# Patient Record
Sex: Male | Born: 1960 | Race: White | Hispanic: No | Marital: Married | Smoking: Never smoker
Health system: Southern US, Community
[De-identification: ages and names within clinical notes are randomized; demographics above are authoritative.]

## PROBLEM LIST (undated history)

## (undated) DIAGNOSIS — G931 Anoxic brain damage, not elsewhere classified: Secondary | ICD-10-CM

## (undated) DIAGNOSIS — F1090 Alcohol use, unspecified, uncomplicated: Secondary | ICD-10-CM

## (undated) DIAGNOSIS — Z7289 Other problems related to lifestyle: Secondary | ICD-10-CM

## (undated) DIAGNOSIS — Z7409 Other reduced mobility: Secondary | ICD-10-CM

## (undated) DIAGNOSIS — E785 Hyperlipidemia, unspecified: Secondary | ICD-10-CM

## (undated) DIAGNOSIS — Z8674 Personal history of sudden cardiac arrest: Secondary | ICD-10-CM

## (undated) DIAGNOSIS — I10 Essential (primary) hypertension: Secondary | ICD-10-CM

## (undated) DIAGNOSIS — G20 Parkinson's disease: Secondary | ICD-10-CM

## (undated) DIAGNOSIS — K219 Gastro-esophageal reflux disease without esophagitis: Secondary | ICD-10-CM

## (undated) DIAGNOSIS — F329 Major depressive disorder, single episode, unspecified: Secondary | ICD-10-CM

## (undated) DIAGNOSIS — Z789 Other specified health status: Secondary | ICD-10-CM

## (undated) DIAGNOSIS — K5790 Diverticulosis of intestine, part unspecified, without perforation or abscess without bleeding: Secondary | ICD-10-CM

## (undated) DIAGNOSIS — R296 Repeated falls: Secondary | ICD-10-CM

## (undated) DIAGNOSIS — G20C Parkinsonism, unspecified: Secondary | ICD-10-CM

## (undated) DIAGNOSIS — K5190 Ulcerative colitis, unspecified, without complications: Secondary | ICD-10-CM

## (undated) DIAGNOSIS — R569 Unspecified convulsions: Secondary | ICD-10-CM

## (undated) DIAGNOSIS — F419 Anxiety disorder, unspecified: Secondary | ICD-10-CM

## (undated) HISTORY — PX: INGUINAL HERNIA REPAIR: SUR1180

## (undated) HISTORY — DX: Ulcerative colitis, unspecified, without complications: K51.90

## (undated) HISTORY — PX: VASECTOMY: SHX75

## (undated) HISTORY — DX: Gastro-esophageal reflux disease without esophagitis: K21.9

## (undated) HISTORY — DX: Essential (primary) hypertension: I10

## (undated) HISTORY — PX: FINGER AMPUTATION: SHX636

## (undated) HISTORY — DX: Anxiety disorder, unspecified: F41.9

## (undated) HISTORY — DX: Unspecified convulsions: R56.9

## (undated) HISTORY — DX: Other specified health status: Z78.9

## (undated) HISTORY — DX: Other problems related to lifestyle: Z72.89

## (undated) HISTORY — DX: Major depressive disorder, single episode, unspecified: F32.9

## (undated) HISTORY — DX: Diverticulosis of intestine, part unspecified, without perforation or abscess without bleeding: K57.90

## (undated) HISTORY — DX: Parkinsonism, unspecified: G20.C

## (undated) HISTORY — DX: Repeated falls: R29.6

## (undated) HISTORY — PX: OTHER SURGICAL HISTORY: SHX169

## (undated) HISTORY — DX: Hyperlipidemia, unspecified: E78.5

## (undated) HISTORY — DX: Anoxic brain damage, not elsewhere classified: G93.1

## (undated) HISTORY — DX: Other reduced mobility: Z74.09

## (undated) HISTORY — DX: Alcohol use, unspecified, uncomplicated: F10.90

## (undated) HISTORY — DX: Personal history of sudden cardiac arrest: Z86.74

## (undated) HISTORY — DX: Parkinson's disease: G20

---

## 2016-04-05 ENCOUNTER — Non-Acute Institutional Stay (SKILLED_NURSING_FACILITY): Payer: 59 | Admitting: Adult Health

## 2016-04-05 DIAGNOSIS — R531 Weakness: Secondary | ICD-10-CM

## 2016-04-05 DIAGNOSIS — R1310 Dysphagia, unspecified: Secondary | ICD-10-CM

## 2016-04-05 DIAGNOSIS — G931 Anoxic brain damage, not elsewhere classified: Secondary | ICD-10-CM

## 2016-04-05 DIAGNOSIS — F32A Depression, unspecified: Secondary | ICD-10-CM

## 2016-04-05 DIAGNOSIS — K51919 Ulcerative colitis, unspecified with unspecified complications: Secondary | ICD-10-CM | POA: Diagnosis not present

## 2016-04-05 DIAGNOSIS — K219 Gastro-esophageal reflux disease without esophagitis: Secondary | ICD-10-CM

## 2016-04-05 DIAGNOSIS — F329 Major depressive disorder, single episode, unspecified: Secondary | ICD-10-CM | POA: Diagnosis not present

## 2016-04-05 DIAGNOSIS — I10 Essential (primary) hypertension: Secondary | ICD-10-CM

## 2016-04-05 DIAGNOSIS — G40909 Epilepsy, unspecified, not intractable, without status epilepticus: Secondary | ICD-10-CM | POA: Diagnosis not present

## 2016-04-05 DIAGNOSIS — G4700 Insomnia, unspecified: Secondary | ICD-10-CM | POA: Diagnosis not present

## 2016-04-05 DIAGNOSIS — E785 Hyperlipidemia, unspecified: Secondary | ICD-10-CM

## 2016-04-05 NOTE — Progress Notes (Signed)
DATE:  04/05/2016  MRN:  [id number]  BIRTHDAY: February 15, 1960  Facility:  Nursing Home Location:  [place] Room Number: 106-P  LEVEL OF CARE:  SNF (31)  Contact Information    None on File       Code Status History    This patient does not have a recorded code status. Please follow your organizational policy for patients in this situation.       Chief Complaint  Patient presents with  . Hospitalization Follow-up    HISTORY OF PRESENT ILLNESS:  This is a 50-YO male seen for hospital follow-up.  He was admitted to [facility] on 04/04/2016 for short-term rehabilitation post admission at [facility] rehabilitation in [place] 02/08/2016-04/04/2016 for acute anoxic encephalopathy. He has PMH of  Ulcerative colitis, hypertension, OSA and remote seizure ~ 10 years ago, S/P cardiac arrest @ football game 01/03/16, with bystander CPR, EMS arrived in 3 mins. Patient was asystole and regained consciousness after 1 round epinephrine. He was intubated in the field and admitted to the hospital. He then had seizure and was given Keppra and Valpoate. He was given Midazolam infusion, weaned off and replaced with Lacosamide and Clonazepam. He had acute rehabilitation there and now wanting to continue short-term rehabilitation @ [facility].    PAST MEDICAL HISTORY:  Past Medical History:  Diagnosis Date  . Alcohol use   . Hyperlipidemia   . Hypertension   . Seizures ([place])   . Ulcerative colitis ([place])      CURRENT MEDICATIONS: Reviewed  Patient's Medications  New Prescriptions   No medications on file  Previous Medications   ACETAMINOPHEN (TYLENOL) 325 MG TABLET    Place 650 mg into feeding tube every 4 (four) hours as needed for mild pain or fever.   ACETAMINOPHEN (TYLENOL) 325 MG TABLET    Take 650 mg by mouth 2 (two) times daily. For 167 doses   AMMONIUM LACTATE (LAC-HYDRIN) 12 % LOTION    Apply 1  application topically 2 (two) times daily. Apply to hands and feet for dry skin for 167 doses   AMPHETAMINE-DEXTROAMPHETAMINE (ADDERALL) 20 MG TABLET    Place 20 mg into feeding tube daily.   ATORVASTATIN (LIPITOR) 80 MG TABLET    Place 80 mg into feeding tube at bedtime.   BISACODYL (DULCOLAX) 10 MG SUPPOSITORY    Place 10 mg rectally every other day.   CARVEDILOL (COREG) 12.5 MG TABLET    Place 12.5 mg into feeding tube 2 (two) times daily with a meal.    CLOTRIMAZOLE-BETAMETH & ZN OX 1-0.05 & 20 % THPK    Apply 1 application topically 2 (two) times daily.   DONEPEZIL (ARICEPT) 5 MG TABLET    Place 5 mg into feeding tube at bedtime.   ENOXAPARIN (LOVENOX) 40 MG/0.4ML INJECTION    Inject 40 mg into the skin at bedtime.   LACOSAMIDE (VIMPAT) 200 MG TABS TABLET    Place 200 mg into feeding tube 2 (two) times daily.   LEVETIRACETAM (KEPPRA) 750 MG TABLET    1,500 mg 2 (two) times daily. Per tube   LISINOPRIL (PRINIVIL,ZESTRIL) 5 MG TABLET    Place 5 mg into feeding tube daily.   MESALAMINE (LIALDA) 1.2 G EC TABLET    2.4 g daily with breakfast. Per tube    MUPIROCIN CREAM (BACTROBAN) 2 %    Apply 1 application topically 2 (two) times daily. Apply to PEG site  PANTOPRAZOLE (PROTONIX) 40 MG TABLET    40 mg daily. Per tube   SERTRALINE (ZOLOFT) 100 MG TABLET    Place 100 mg into feeding tube daily.   THIAMINE 100 MG TABLET    Place 100 mg into feeding tube daily.   TRAZODONE (DESYREL) 50 MG TABLET    Place 50 mg into feeding tube at bedtime.  Modified Medications   No medications on file  Discontinued Medications   CLOTRIMAZOLE-BETAMETHASONE (LOTRISONE) CREAM    Apply 1 application topically 2 (two) times daily. Apply to bottom for protection   NUTRITIONAL SUPPLEMENTS (FEEDING SUPPLEMENT, OSMOLITE 1.5 CAL,) LIQD    Place 240 mLs into feeding tube continuous. If <50% of meal consumed     Allergies  Allergen Reactions  . Fluconazole      REVIEW OF SYSTEMS:  Unable to obtain due to anoxic  encephalopathy    PHYSICAL EXAMINATION  GENERAL APPEARANCE: Well nourished. In no acute distress. Normal body habitus SKIN:  Skin is warm and dry.  HEAD: Normal in size and contour. No evidence of trauma EYES: Lids open and close normally. No blepharitis, entropion or ectropion. PERRL. Conjunctivae are clear and sclerae are white. Lenses are without opacity EARS: Pinnae are normal. Patient hears normal voice tunes of the examiner MOUTH and THROAT: Lips are without lesions. Oral mucosa is moist and without lesions. 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 Affect and behavior are appropriate   LABS/RADIOLOGY: Labs reviewed: Basic Metabolic Panel:  Recent Labs  04/06/16  NA 142  K 4.2  BUN 11  CREATININE 0.7   Liver Function Tests:  Recent Labs  04/06/16  AST 17  ALT 32  ALKPHOS 97   CBC:  Recent Labs  04/06/16  WBC 8.3  HGB 14.1  HCT 41  PLT 298    ASSESSMENT/PLAN:  Generalized weakness - for rehabilitation, PT and OT, for therapeutic strengthening exercises; fall precaution; continue Lovenox 40 mg subcutaneous daily at bedtime; check CBC  Anoxic encephalopathy - S/P cardiac arrest and seizure, continue supportive care; continue Adderall 20 mg 1 tab daily and Aricept 5 mg 1 tab daily at bedtime  Dysphagia - currently on mechanical soft  NAS diet and has bolus of osmolite 1.5 240 ml via PEG tube if meal intake < 50%; for Speech therapy evaluation;  aspiration precaution  Hypertension - continue Coreg 12.5 mg 1 tab every 12 hours, lisinopril 5 mg 1 tab daily; check BMP  Seizure - continue Lacosamide  200 mg every 12 hours and Keppra 750 mg 2 tabs = 1500 mg every 12 hours  GERD - Continue Protonix 40 mg daily  Depression - continue Zoloft 100 mg 1 tab daily  Ulcerative colitis - continue mesalamine DR 1.2 g 2 tabs daily  Hyperlipidemia - continue Lipitor 80 mg 1 tab daily at bedtime  Insomnia - continue trazodone 50 mg daily at bedtime    Goals of care:  Short-term rehabilitation    Wilburt Messina C. [facility] [phone]

## 2016-04-06 LAB — CBC AND DIFFERENTIAL
HEMATOCRIT: 41 % (ref 41–53)
HEMOGLOBIN: 14.1 g/dL (ref 13.5–17.5)
PLATELETS: 298 10*3/uL (ref 150–399)
WBC: 8.3 10*3/mL

## 2016-04-06 LAB — HEPATIC FUNCTION PANEL
ALK PHOS: 97 U/L (ref 25–125)
ALT: 32 U/L (ref 10–40)
AST: 17 U/L (ref 14–40)
BILIRUBIN, TOTAL: 0.5 mg/dL

## 2016-04-06 LAB — BASIC METABOLIC PANEL
BUN: 11 mg/dL (ref 4–21)
Creatinine: 0.7 mg/dL (ref 0.6–1.3)
Glucose: 92 mg/dL
Potassium: 4.2 mmol/L (ref 3.4–5.3)
Sodium: 142 mmol/L (ref 137–147)

## 2016-04-07 ENCOUNTER — Non-Acute Institutional Stay (SKILLED_NURSING_FACILITY): Payer: 59 | Admitting: Internal Medicine

## 2016-04-07 ENCOUNTER — Encounter: Payer: Self-pay | Admitting: Internal Medicine

## 2016-04-07 DIAGNOSIS — K5900 Constipation, unspecified: Secondary | ICD-10-CM

## 2016-04-07 DIAGNOSIS — F32A Depression, unspecified: Secondary | ICD-10-CM | POA: Insufficient documentation

## 2016-04-07 DIAGNOSIS — K5190 Ulcerative colitis, unspecified, without complications: Secondary | ICD-10-CM | POA: Insufficient documentation

## 2016-04-07 DIAGNOSIS — R1310 Dysphagia, unspecified: Secondary | ICD-10-CM | POA: Insufficient documentation

## 2016-04-07 DIAGNOSIS — G931 Anoxic brain damage, not elsewhere classified: Secondary | ICD-10-CM | POA: Insufficient documentation

## 2016-04-07 DIAGNOSIS — K219 Gastro-esophageal reflux disease without esophagitis: Secondary | ICD-10-CM | POA: Diagnosis not present

## 2016-04-07 DIAGNOSIS — I10 Essential (primary) hypertension: Secondary | ICD-10-CM | POA: Diagnosis not present

## 2016-04-07 DIAGNOSIS — G40909 Epilepsy, unspecified, not intractable, without status epilepticus: Secondary | ICD-10-CM | POA: Diagnosis not present

## 2016-04-07 DIAGNOSIS — Z87898 Personal history of other specified conditions: Secondary | ICD-10-CM | POA: Diagnosis not present

## 2016-04-07 DIAGNOSIS — E785 Hyperlipidemia, unspecified: Secondary | ICD-10-CM | POA: Insufficient documentation

## 2016-04-07 DIAGNOSIS — F329 Major depressive disorder, single episode, unspecified: Secondary | ICD-10-CM | POA: Diagnosis not present

## 2016-04-07 DIAGNOSIS — R531 Weakness: Secondary | ICD-10-CM | POA: Diagnosis not present

## 2016-04-07 DIAGNOSIS — K51919 Ulcerative colitis, unspecified with unspecified complications: Secondary | ICD-10-CM | POA: Diagnosis not present

## 2016-04-07 NOTE — Progress Notes (Signed)
LOCATION: [place]  PCP: No primary care provider on file.   Code Status: Full Code  Goals of care: Advanced Directive information No flowsheet data found.     No emergency contact information on file.   Allergies  Allergen Reactions  . Fluconazole     Chief Complaint  Patient presents with  . New Admit To SNF    New Admission Visit      HPI:  Patient is a 56 y.o. male seen today for short term rehabilitation post admission in inpatient rehabilitation from 02/08/16-04/04/16 at [place] post acute anoxic encephalopathy secondary to cardiac arrest with status epilepticus. He required intubation, tracheostomy and peg tube during that hospitalization. No hospital admission and discharge summary available for review. Patient has medical history of ulcerative colitis, seizure disorder, HTN, HLD on chart review. He is seen in his room today. His trach has been cannulated. He is eating around 75% of his meals per nursing. He has encephalopathy and has limited participation in HPI and ROS.   Review of Systems:  Constitutional: Negative for fever, chills, diaphoresis. Feels weak and tired.  HENT: Negative for headache, congestion, nasal discharge, difficulty swallowing.   Eyes: Negative for eye pain, blurred vision, double vision and discharge.  Respiratory: Negative for cough, shortness of breath and wheezing.   Cardiovascular: Negative for chest pain, palpitations, leg swelling.  Gastrointestinal: Negative for heartburn, nausea, vomiting, abdominal pain. Last bowel movement was this morning. Genitourinary: Negative for dysuria Musculoskeletal: Negative for fall in the facility.  Skin: Negative for itching, rash.  Neurological: Negative for dizziness.    Past Medical History:  Diagnosis Date  . Alcohol use   . Hyperlipidemia   . Hypertension   . Seizures ([place])   . Ulcerative colitis ([place])    History reviewed. No pertinent surgical history. Social History:  has no tobacco, alcohol, and drug history on file.  History reviewed. No pertinent family history.  Medications: Allergies as of 04/07/2016      Reactions   Fluconazole       Medication List       Accurate as of 04/07/16  2:40 PM. Always use your most recent med list.          acetaminophen 325 MG tablet Commonly known as:  TYLENOL Place 650 mg into feeding tube every 4 (four) hours as needed for mild pain or fever.   acetaminophen 325 MG tablet Commonly known as:  TYLENOL Take 650 mg by mouth 2 (two) times daily. For 167 doses   ammonium lactate 12 % lotion Commonly known as:  LAC-HYDRIN Apply 1 application topically 2 (two) times daily. Apply to hands and feet for dry skin for 167 doses   amphetamine-dextroamphetamine 20 MG tablet Commonly known as:  ADDERALL Place 20 mg into feeding tube daily.   atorvastatin 80 MG tablet Commonly known as:  LIPITOR Place 80 mg into feeding tube at bedtime.   BACTROBAN 2 % Generic drug:  mupirocin cream Apply 1 application topically 2 (two) times daily. Apply to PEG site   bisacodyl 10 MG suppository Commonly known as:  DULCOLAX Place 10 mg rectally every other day.   carvedilol 12.5 MG tablet Commonly known as:  COREG Place 12.5 mg into feeding tube 2 (two) times daily with a meal.   Clotrimazole-Betameth & Zn Ox 1-0.05 & 20 % Thpk Apply 1 application topically 2 (two) times daily.   donepezil 5 MG tablet Commonly known as:  ARICEPT Place 5 mg into feeding  tube at bedtime.   enoxaparin 40 MG/0.4ML injection Commonly known as:  LOVENOX Inject 40 mg into the skin at bedtime.   lacosamide 200 MG Tabs tablet Commonly known as:  VIMPAT Place 200 mg into feeding tube 2 (two) times daily.   levETIRAcetam 750 MG tablet Commonly known as:  KEPPRA 1,500 mg 2 (two) times daily. Per tube   lisinopril 5 MG tablet Commonly known as:  PRINIVIL,ZESTRIL Place 5 mg into feeding tube daily.   mesalamine 1.2 g EC  tablet Commonly known as:  LIALDA 2.4 g daily with breakfast. Per tube   pantoprazole 40 MG tablet Commonly known as:  PROTONIX 40 mg daily. Per tube   sertraline 100 MG tablet Commonly known as:  ZOLOFT Place 100 mg into feeding tube daily.   thiamine 100 MG tablet Place 100 mg into feeding tube daily.   traZODone 50 MG tablet Commonly known as:  DESYREL Place 50 mg into feeding tube at bedtime.       Immunizations:  There is no immunization history on file for this patient.   Physical Exam: Vitals:   04/07/16 1050  BP: 115/76  Pulse: 69  Resp: 16  Temp: (!) 96.1 F (35.6 C)  TempSrc: Oral  SpO2: 95%   There is no height or weight on file to calculate BMI.  General- adult male, well built, in no acute distress Head- normocephalic, atraumatic Nose- no nasal discharge Throat- moist mucus membrane, normal oropharynx Eyes- PERRLA, EOMI, no pallor, no icterus, no discharge, normal conjunctiva, normal sclera Neck- no cervical lymphadenopathy Cardiovascular- normal s1,s2, no murmur Respiratory- bilateral clear to auscultation, no wheeze, no rhonchi, no crackles, no use of accessory muscles, trach stoma closed.  Abdomen- bowel sounds present, soft, non tender, no guarding or rigidity, no CVA tenderness, peg tube in place with site clean Musculoskeletal- able to move all 4 extremities, generalized weakness, on wheelchair, no leg edema, right middle finger amputated Neurological- alert and oriented to self only Skin- warm and dry Psychiatry- normal mood, poor insight    Labs reviewed: Basic Metabolic Panel:  Recent Labs  04/06/16  NA 142  K 4.2  BUN 11  CREATININE 0.7   Liver Function Tests:  Recent Labs  04/06/16  AST 17  ALT 32  ALKPHOS 97   No results for input(s): LIPASE, AMYLASE in the last 8760 hours. No results for input(s): AMMONIA in the last 8760 hours. CBC:  Recent Labs  04/06/16  WBC 8.3  HGB 14.1  HCT 41  PLT 298      Assessment/Plan  Generalized weakness Will have him work with physical therapy and occupational therapy team to help with gait training and muscle strengthening exercises.fall precautions. Skin care. Encourage to be out of bed. Currently on lovenox for dvt prophylaxis. Will need to reassess and discontinue this once mobility improves  Anoxic encephalopathy 2/2 cardiac arrest and seizure. Continue adderall with aricpet and provide supportive care. SLP consult.   Dysphagia Aspiration precautions, peg tube feed and assist with po feed. RD and SLP to follow. Patient has been taking 75% of his meals for breakfast and lunch, he is not a big dinner person. Evaluate his daily intake and weight and consider stopping peg tube feed if po intake is good.   Hypertension Monitor BP reading, continue coreg 12.5 mg bid, lisinopril 5 mg daily. Reviewed BMP  Seizure disorder Continue lacosamide 200 mg bid and keppra 1500 mg bid.   gerd Stable, continue protonix  Depression Stable mood,  psych consult, continue zoloft and trazodone  Constipation On dulcolax suppository qod for now  History of alcohol use Continue thiamine  Ulcerative colitis No flare up reported. Continue mesalamine for now  Hyperlipidemia Check lipid panel, continue lipitor for now    Goals of care: short term rehabilitation   Labs/tests ordered: lipid panel  Family/ staff Communication: reviewed care plan with patient and nursing supervisor    Blanchie Serve, MD Internal Medicine [place], [place] [address] Cell Phone (Monday-Friday 8 am - 5 pm): [phone] On Call: [phone] and follow prompts after 5 pm and on weekends Office Phone: [phone] Office Fax: [phone]

## 2016-04-27 LAB — BASIC METABOLIC PANEL
BUN: 11 mg/dL (ref 4–21)
CREATININE: 0.7 mg/dL (ref 0.6–1.3)
Glucose: 90 mg/dL
Potassium: 3.7 mmol/L (ref 3.4–5.3)
SODIUM: 142 mmol/L (ref 137–147)

## 2016-04-27 LAB — CBC AND DIFFERENTIAL
HEMATOCRIT: 40 % — AB (ref 41–53)
Hemoglobin: 14 g/dL (ref 13.5–17.5)
PLATELETS: 298 10*3/uL (ref 150–399)
WBC: 7.4 10^3/mL

## 2016-05-14 ENCOUNTER — Encounter: Payer: Self-pay | Admitting: Adult Health

## 2016-05-14 ENCOUNTER — Non-Acute Institutional Stay (SKILLED_NURSING_FACILITY): Payer: 59 | Admitting: Adult Health

## 2016-05-14 DIAGNOSIS — I10 Essential (primary) hypertension: Secondary | ICD-10-CM

## 2016-05-14 DIAGNOSIS — G4700 Insomnia, unspecified: Secondary | ICD-10-CM

## 2016-05-14 DIAGNOSIS — G931 Anoxic brain damage, not elsewhere classified: Secondary | ICD-10-CM | POA: Diagnosis not present

## 2016-05-14 DIAGNOSIS — E785 Hyperlipidemia, unspecified: Secondary | ICD-10-CM | POA: Diagnosis not present

## 2016-05-14 DIAGNOSIS — G40909 Epilepsy, unspecified, not intractable, without status epilepticus: Secondary | ICD-10-CM

## 2016-05-14 DIAGNOSIS — K219 Gastro-esophageal reflux disease without esophagitis: Secondary | ICD-10-CM | POA: Diagnosis not present

## 2016-05-14 DIAGNOSIS — R1310 Dysphagia, unspecified: Secondary | ICD-10-CM

## 2016-05-14 DIAGNOSIS — F329 Major depressive disorder, single episode, unspecified: Secondary | ICD-10-CM

## 2016-05-14 DIAGNOSIS — K51919 Ulcerative colitis, unspecified with unspecified complications: Secondary | ICD-10-CM | POA: Diagnosis not present

## 2016-05-14 DIAGNOSIS — F32A Depression, unspecified: Secondary | ICD-10-CM

## 2016-05-14 NOTE — Progress Notes (Signed)
DATE: 05/14/2016  MRN:  [id number]  BIRTHDAY: Sep 08, 1960  Facility:  Nursing Home Location:  [place] Room Number: 803-B  LEVEL OF CARE:  SNF (77)  Contact Information    Name Relation Home Work Mobile   Contact,No Other [phone]         Code Status History    This patient does not have a recorded code status. Please follow your organizational policy for patients in this situation.       Chief Complaint  Patient presents with  . Medical Management of Chronic Issues    HISTORY OF PRESENT ILLNESS:  This is a 79-YO male who is currently having short-term rehabilitation. He was seen in the room and did not verbalize any concerns. He was recently discharged from PT and [place]. His Trazodone dosage was increased from 50 mg to 75 mg Q HS.  He was admitted to [facility] on 04/04/2016 for short-term rehabilitation post admission at [facility] rehabilitation in [place] 02/08/2016-04/04/2016 for acute anoxic encephalopathy. He has PMH of  Ulcerative colitis, hypertension, OSA and remote seizure ~ 10 years ago, S/P cardiac arrest @ football game 01/03/16, with bystander CPR, EMS arrived in 3 mins. Patient was asystole and regained consciousness after 1 round epinephrine. He was intubated in the field and admitted to the hospital. He then had seizure and was given Keppra and Valpoate. He was given Midazolam infusion, weaned off and replaced with Lacosamide and Clonazepam.    PAST MEDICAL HISTORY:  Past Medical History:  Diagnosis Date  . Alcohol use   . Hyperlipidemia   . Hypertension   . Seizures ([place])   . Ulcerative colitis ([place])      CURRENT MEDICATIONS: Reviewed  Patient's Medications  New Prescriptions   No medications on file  Previous Medications   ACETAMINOPHEN (TYLENOL) 325 MG TABLET    Place 650 mg into feeding tube every 4 (four) hours as needed for mild pain or fever.   ACETAMINOPHEN (TYLENOL) 325 MG  TABLET    Take 650 mg by mouth 2 (two) times daily. For 167 doses   AMMONIUM LACTATE (LAC-HYDRIN) 12 % LOTION    Apply 1 application topically 2 (two) times daily. Apply to hands and feet for dry skin for 167 doses   ATORVASTATIN (LIPITOR) 40 MG TABLET    Take 40 mg by mouth daily.   BISACODYL (DULCOLAX) 10 MG SUPPOSITORY    Place 10 mg rectally every other day.   CARVEDILOL (COREG) 12.5 MG TABLET    Place 12.5 mg into feeding tube 2 (two) times daily with a meal.    CLOTRIMAZOLE-BETAMETH & ZN OX 1-0.05 & 20 % THPK    Apply 1 application topically 2 (two) times daily.   DONEPEZIL (ARICEPT) 5 MG TABLET    Place 5 mg into feeding tube at bedtime.   GUAIFENESIN (ROBITUSSIN) 100 MG/5ML SYRUP    Take 200 mg by mouth every 4 (four) hours as needed for cough.   LEVETIRACETAM (KEPPRA) 750 MG TABLET    1,500 mg 2 (two) times daily. Per tube   LISINOPRIL (PRINIVIL,ZESTRIL) 5 MG TABLET    Place 5 mg into feeding tube daily.   MESALAMINE (LIALDA) 1.2 G EC TABLET    2.4 g daily with breakfast. Per tube    MUPIROCIN CREAM (BACTROBAN) 2 %    Apply 1 application topically 2 (two) times daily. Apply to PEG site   NUTRITIONAL SUPPLEMENT LIQD  Take 120 mLs by mouth 3 (three) times daily.   NUTRITIONAL SUPPLEMENTS (FEEDING SUPPLEMENT, OSMOLITE 1.5 CAL,) LIQD    Place 237 mLs into feeding tube. Bolus 1 can if patient fails to consume <50% of his meal   PANTOPRAZOLE (PROTONIX) 40 MG TABLET    40 mg daily. Per tube   SERTRALINE (ZOLOFT) 100 MG TABLET    Place 150 mg into feeding tube daily. Take 1-1/2 tablets to = 150 mg   THIAMINE 100 MG TABLET    Place 100 mg into feeding tube daily.   TRAZODONE (DESYREL) 50 MG TABLET    Place 50 mg into feeding tube at bedtime.  Modified Medications   No medications on file  Discontinued Medications   AMPHETAMINE-DEXTROAMPHETAMINE (ADDERALL) 20 MG TABLET    Place 20 mg into feeding tube daily.   ATORVASTATIN (LIPITOR) 80 MG TABLET    Place 80 mg into feeding tube at bedtime.    ENOXAPARIN (LOVENOX) 40 MG/0.4ML INJECTION    Inject 40 mg into the skin at bedtime.   LACOSAMIDE (VIMPAT) 200 MG TABS TABLET    Place 200 mg into feeding tube 2 (two) times daily.     Allergies  Allergen Reactions  . Fluconazole      REVIEW OF SYSTEMS:  Unable to obtain due to anoxic encephalopathy    PHYSICAL EXAMINATION  GENERAL APPEARANCE: Well nourished. In no acute distress. Normal body habitus SKIN:  Skin is warm and dry.  HEAD: Normal in size and contour. No evidence of trauma EYES: Lids open and close normally. No blepharitis, entropion or ectropion. PERRL. Conjunctivae are clear and sclerae are white. Lenses are without opacity EARS: Pinnae are normal. Patient hears normal voice tunes of the examiner MOUTH and THROAT: Lips are without lesions. Oral mucosa is moist and without lesions. 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 Affect and behavior are appropriate   LABS/RADIOLOGY: Labs reviewed: Basic Metabolic Panel:  Recent Labs  04/06/16  NA 142  K 4.2  BUN 11  CREATININE 0.7   Liver Function Tests:  Recent Labs  04/06/16  AST 17  ALT 32  ALKPHOS 97   CBC:  Recent Labs  04/06/16  WBC 8.3  HGB 14.1  HCT 41  PLT 298    ASSESSMENT/PLAN:  Anoxic encephalopathy - S/P cardiac arrest and seizure, continue supportive care; continue Adderall 20 mg 1 tab daily and Aricept 5 mg 1 tab daily at bedtime  Dysphagia - currently on mechanical soft   NAS diet and has bolus of osmolite 1.5 240 ml via PEG tube if meal intake < 50%; for Speech therapy evaluation; aspiration precaution  Hypertension -  Well-controlled; continue Coreg 12.5 mg 1 tab every 12 hours and lisinopril 5 mg 1 tab daily  Seizure - continue Lacosamide  200 mg every 12 hours and Keppra 750 mg 2 tabs = 1500 mg every 12 hours  GERD - Continue Protonix 40 mg daily  Depression - mood is stable; continue Zoloft 100 mg 1 tab daily  Ulcerative colitis - continue mesalamine DR 1.2 g 2 tabs daily  Hyperlipidemia - decrease Lipitor from 80 mg to 40 mg1 tab daily at bedtime per wife's request due to muscle aches  Insomnia - recently increased trazodone from 50 mg to 75 mg  at bedtime    Goals of care:  Short-term rehabilitation    Shelle Galdamez C. [place] - NP    [employer] [phone]

## 2016-06-08 ENCOUNTER — Emergency Department (HOSPITAL_COMMUNITY): Payer: 59

## 2016-06-08 ENCOUNTER — Encounter: Payer: Self-pay | Admitting: Adult Health

## 2016-06-08 ENCOUNTER — Non-Acute Institutional Stay (SKILLED_NURSING_FACILITY): Payer: 59 | Admitting: Adult Health

## 2016-06-08 ENCOUNTER — Other Ambulatory Visit: Payer: Self-pay

## 2016-06-08 ENCOUNTER — Emergency Department (HOSPITAL_COMMUNITY)
Admission: EM | Admit: 2016-06-08 | Discharge: 2016-06-09 | Disposition: A | Discharge status: Home / Self Care | Payer: 59 | Attending: Emergency Medicine | Admitting: Emergency Medicine

## 2016-06-08 ENCOUNTER — Encounter (HOSPITAL_COMMUNITY): Payer: Self-pay | Admitting: Pharmacy Technician

## 2016-06-08 DIAGNOSIS — G40909 Epilepsy, unspecified, not intractable, without status epilepticus: Secondary | ICD-10-CM | POA: Diagnosis not present

## 2016-06-08 DIAGNOSIS — E785 Hyperlipidemia, unspecified: Secondary | ICD-10-CM | POA: Diagnosis not present

## 2016-06-08 DIAGNOSIS — G4700 Insomnia, unspecified: Secondary | ICD-10-CM | POA: Diagnosis not present

## 2016-06-08 DIAGNOSIS — K51919 Ulcerative colitis, unspecified with unspecified complications: Secondary | ICD-10-CM

## 2016-06-08 DIAGNOSIS — F0390 Unspecified dementia without behavioral disturbance: Secondary | ICD-10-CM | POA: Diagnosis not present

## 2016-06-08 DIAGNOSIS — I10 Essential (primary) hypertension: Secondary | ICD-10-CM | POA: Insufficient documentation

## 2016-06-08 DIAGNOSIS — K219 Gastro-esophageal reflux disease without esophagitis: Secondary | ICD-10-CM

## 2016-06-08 DIAGNOSIS — R1310 Dysphagia, unspecified: Secondary | ICD-10-CM

## 2016-06-08 DIAGNOSIS — R69 Illness, unspecified: Secondary | ICD-10-CM

## 2016-06-08 DIAGNOSIS — F329 Major depressive disorder, single episode, unspecified: Secondary | ICD-10-CM

## 2016-06-08 DIAGNOSIS — Z5181 Encounter for therapeutic drug level monitoring: Secondary | ICD-10-CM | POA: Insufficient documentation

## 2016-06-08 DIAGNOSIS — Z87898 Personal history of other specified conditions: Secondary | ICD-10-CM | POA: Diagnosis not present

## 2016-06-08 DIAGNOSIS — Z79899 Other long term (current) drug therapy: Secondary | ICD-10-CM | POA: Diagnosis not present

## 2016-06-08 DIAGNOSIS — R569 Unspecified convulsions: Secondary | ICD-10-CM | POA: Diagnosis present

## 2016-06-08 DIAGNOSIS — F32A Depression, unspecified: Secondary | ICD-10-CM

## 2016-06-08 LAB — CBC WITH DIFFERENTIAL/PLATELET
Basophils Absolute: 0 10*3/uL (ref 0.0–0.1)
Basophils Relative: 0 %
EOS ABS: 0.3 10*3/uL (ref 0.0–0.7)
EOS PCT: 3 %
HCT: 40.1 % (ref 39.0–52.0)
Hemoglobin: 13.9 g/dL (ref 13.0–17.0)
LYMPHS ABS: 2.6 10*3/uL (ref 0.7–4.0)
LYMPHS PCT: 21 %
MCH: 31.1 pg (ref 26.0–34.0)
MCHC: 34.7 g/dL (ref 30.0–36.0)
MCV: 89.7 fL (ref 78.0–100.0)
MONO ABS: 0.6 10*3/uL (ref 0.1–1.0)
MONOS PCT: 5 %
Neutro Abs: 8.7 10*3/uL — ABNORMAL HIGH (ref 1.7–7.7)
Neutrophils Relative %: 71 %
PLATELETS: 273 10*3/uL (ref 150–400)
RBC: 4.47 MIL/uL (ref 4.22–5.81)
RDW: 12.5 % (ref 11.5–15.5)
WBC: 12.3 10*3/uL — ABNORMAL HIGH (ref 4.0–10.5)

## 2016-06-08 LAB — COMPREHENSIVE METABOLIC PANEL
ALK PHOS: 84 U/L (ref 38–126)
ALT: 19 U/L (ref 17–63)
AST: 17 U/L (ref 15–41)
Albumin: 3.4 g/dL — ABNORMAL LOW (ref 3.5–5.0)
Anion gap: 9 (ref 5–15)
BUN: 11 mg/dL (ref 6–20)
CALCIUM: 8.9 mg/dL (ref 8.9–10.3)
CHLORIDE: 103 mmol/L (ref 101–111)
CO2: 25 mmol/L (ref 22–32)
CREATININE: 0.71 mg/dL (ref 0.61–1.24)
GFR calc non Af Amer: >60 mL/min (ref >60)
GLUCOSE: 121 mg/dL — AB (ref 65–99)
Potassium: 3.6 mmol/L (ref 3.5–5.1)
SODIUM: 137 mmol/L (ref 135–145)
Total Bilirubin: 0.6 mg/dL (ref 0.3–1.2)
Total Protein: 6.2 g/dL — ABNORMAL LOW (ref 6.5–8.1)

## 2016-06-08 LAB — I-STAT TROPONIN, ED: TROPONIN I, POC: 0 ng/mL (ref 0.00–0.08)

## 2016-06-08 LAB — PROTIME-INR
INR: 1.16
Prothrombin Time: 14.9 seconds (ref 11.4–15.2)

## 2016-06-08 LAB — LIPASE, BLOOD: Lipase: 30 U/L (ref 11–51)

## 2016-06-08 MED ORDER — LEVETIRACETAM 500 MG PO TABS
1500.0000 mg | ORAL_TABLET | Freq: Once | ORAL | Status: AC
  Administered 2016-06-09: 1500 mg via ORAL
  Filled 2016-06-08: qty 3

## 2016-06-08 MED ORDER — SODIUM CHLORIDE 0.9 % IV SOLN
200.0000 mg | Freq: Two times a day (BID) | INTRAVENOUS | Status: DC
  Administered 2016-06-08: 200 mg via INTRAVENOUS
  Filled 2016-06-08 (×2): qty 20

## 2016-06-08 NOTE — ED Triage Notes (Signed)
BIB EMS from [facility], pt had seizure lasting approx 5 min, pt is post ictal at this time. Frequent PVC's en route. Pt has only had 1 seizure before in march leading to cardiac arrest. EDP aware. VSS. CBG 111.

## 2016-06-08 NOTE — ED Provider Notes (Signed)
[facility] DEPT Provider Note   CSN: [id number] Arrival date & time: 06/08/16  1911     History   Chief Complaint Chief Complaint  Patient presents with  . Seizures    HPI Alexander Henderson is a 56 y.o. male.  HPI Patient has complex medical history with sudden onset of an arrest situation March a year ago. He had been with his wife at a football game where they had been tailgating and very suddenly and unexpectedly became unresponsive with possible seizure-like activity. He then went into cardiac arrest and had a prolonged resuscitation on route to the hospital. He was resuscitated in the emergency department but then had prolonged cardiac ICU hospitalization with hypothermia. When bringing the patient on hypothermia he did not respond appropriately and it was determined he would had constant ongoing seizure activity. He then subsequently was in the neurologic ICU and induced coma to control seizures. Total hospitalization was approximately 30 days. Since that time patient has been going on undergoing rehabilitation and is currently in nursing home care. His wife is with him. She reports that he has made significant improvement and is walking with a walker. He can interact verbally although he has significant limitations now. He did not ever develop seizure activity after that first hospitalization. He has been on Keppra continuously. His wife notes however that the MR indicates Vimpat was discontinued 2 months ago and she had not been made aware of that. This evening when she went to visit him after work he was slightly less interactive than normal and less inclined to eat. He then stiffened up and his eyes rolled back and he had approximately 5 minutes of seizure activity. Upon EMS arrival symptoms were beginning to abate. He had a brief postictal period but by the time he arrives emergency department he was back to baseline. The patient's wife visits him regularly and is very familiar with  his care and daily activities. The patient cannot provide review systems but she reports he has not shown signs of acute illness. He has not had fevers, chills, vomiting. There've been no appetite change until just before the seizure occurred. Past Medical History:  Diagnosis Date  . Alcohol use   . Hyperlipidemia   . Hypertension   . Seizures ([place])   . Ulcerative colitis [facility])     Patient Active Problem List   Diagnosis Date Noted  . Anoxic encephalopathy ([place]) 04/07/2016  . Dysphagia 04/07/2016  . Essential hypertension 04/07/2016  . Seizure disorder ([place]) 04/07/2016  . Gastroesophageal reflux disease 04/07/2016  . Depression 04/07/2016  . History of alcohol use 04/07/2016  . Ulcerative colitis with complication ([place]) 50/53/9767  . Hyperlipidemia 04/07/2016    History reviewed. No pertinent surgical history.     Home Medications    Prior to Admission medications   Medication Sig Start Date End Date Taking? Authorizing Provider  acetaminophen (TYLENOL) 325 MG tablet Take 650 mg by mouth every 4 (four) hours as needed for mild pain or fever.    Yes [provider]  acetaminophen (TYLENOL) 325 MG tablet Take 650 mg by mouth 2 (two) times daily. For 167 doses   Yes [provider]  ammonium lactate (LAC-HYDRIN) 12 % lotion Apply 1 application topically 2 (two) times daily. Apply to hands and feet for dry skin for 167 doses   Yes [provider]  atorvastatin (LIPITOR) 40 MG tablet Take 40 mg by mouth daily.   Yes [provider]  bisacodyl (DULCOLAX) 10 MG  suppository Place 10 mg rectally every other day.   Yes [provider]  carvedilol (COREG) 12.5 MG tablet Take 12.5 mg by mouth 2 (two) times daily with a meal.    Yes [provider]  Clotrimazole-Betameth & Zn Ox 1-0.05 & 20 % THPK Apply 1 application topically 2 (two) times daily.   Yes [provider]  donepezil (ARICEPT) 5 MG tablet Take 5 mg by mouth at  bedtime.    Yes [provider]  levETIRAcetam (KEPPRA) 750 MG tablet Take 1,500 mg by mouth 2 (two) times daily. Per tube    Yes [provider]  lisinopril (PRINIVIL,ZESTRIL) 5 MG tablet Place 5 mg into feeding tube daily.   Yes [provider]  mesalamine (LIALDA) 1.2 g EC tablet Take 2.4 g by mouth daily with breakfast.    Yes [provider]  mupirocin cream (BACTROBAN) 2 % Apply 1 application topically 2 (two) times daily. Apply to PEG site   Yes [provider]  NUTRITIONAL SUPPLEMENT LIQD Take 120 mLs by mouth 3 (three) times daily.   Yes [provider]  Nutritional Supplements (FEEDING SUPPLEMENT, OSMOLITE 1.5 CAL,) LIQD Place 237 mLs into feeding tube. Bolus 1 can if patient fails to consume <50% of his meal   Yes [provider]  pantoprazole (PROTONIX) 40 MG tablet 40 mg daily.    Yes [provider]  sertraline (ZOLOFT) 100 MG tablet Take 150 mg by mouth daily. Take 1-1/2 tablets to = 150 mg   Yes [provider]  thiamine 100 MG tablet Take 100 mg by mouth daily.    Yes [provider]  traZODone (DESYREL) 50 MG tablet Take 75 mg by mouth at bedtime. Take 1-1/2 tablets to - 75 mg   Yes [provider]  lacosamide (VIMPAT) 200 MG TABS tablet Take 1 tablet (200 mg total) by mouth 2 (two) times daily. 06/09/16   Charlesetta Shanks, MD    Family History No family history on file.  Social History Social History  Substance Use Topics  . Smoking status: Unknown If Ever Smoked  . Smokeless tobacco: Never Used  . Alcohol use No     Comment: Formerlly interminttenly used     Allergies   Fluconazole   Review of Systems Review of Systems Level V caveat cannot obtain due to patient baseline cognitive impairment.  Physical Exam Updated Vital Signs BP 110/74   Pulse 80   Temp 98.2 F (36.8 C) (Oral)   Resp 18   SpO2 94%   Physical Exam  Constitutional: He appears well-developed  and well-nourished. No distress.  HENT:  Head: Normocephalic and atraumatic.  Mouth/Throat: Oropharynx is clear and moist.  Eyes: Conjunctivae and EOM are normal. Pupils are equal, round, and reactive to light.  Neck: Neck supple.  Cardiovascular: Normal rate, regular rhythm, normal heart sounds and intact distal pulses.   No murmur heard. Pulmonary/Chest: Effort normal and breath sounds normal. No respiratory distress.  Abdominal: Soft. He exhibits no distension. There is no tenderness.  Musculoskeletal: He exhibits no edema.  Neurological: He is alert.  Patient has cognitive impairment but answers simple questions with one or 2 word responses. He follows commands for extraocular motions. He performs grip strength but has limitations to overall upper extremity movement and coordination. He will move each lower extremity upon request but is somewhat incoordinated.  Skin: Skin is warm and dry.  Psychiatric: He has a normal mood and affect.  Nursing note and  vitals reviewed.    ED Treatments / Results  Labs (all labs ordered are listed, but only abnormal results are displayed) Labs Reviewed  COMPREHENSIVE METABOLIC PANEL - Abnormal; Notable for the following:       Result Value   Glucose, Bld 121 (*)    Total Protein 6.2 (*)    Albumin 3.4 (*)    All other components within normal limits  CBC WITH DIFFERENTIAL/PLATELET - Abnormal; Notable for the following:    WBC 12.3 (*)    Neutro Abs 8.7 (*)    All other components within normal limits  URINALYSIS, ROUTINE W REFLEX MICROSCOPIC - Abnormal; Notable for the following:    APPearance HAZY (*)    Ketones, ur 5 (*)    Protein, ur 30 (*)    Bacteria, UA RARE (*)    Squamous Epithelial / LPF 0-5 (*)    All other components within normal limits  LIPASE, BLOOD  PROTIME-INR  LEVETIRACETAM LEVEL  I-STAT TROPOININ, ED    EKG  EKG Interpretation None       Radiology Dg Chest Port 1 View  Result Date: 06/08/2016 CLINICAL  DATA:  Seizure tonight. EXAM: PORTABLE CHEST 1 VIEW COMPARISON:  None. FINDINGS: Cardiomediastinal silhouette is unremarkable for this low inspiratory examination with crowded vasculature markings. The lungs are clear without pleural effusions or focal consolidations. Trachea projects midline and there is no pneumothorax. Included soft tissue planes and osseous structures are non-suspicious. Round plastic appearing foreign body projecting in stomach could represent gastrostomy tube or, ingested foreign body. IMPRESSION: No acute cardiopulmonary process. Electronically Signed   By: Elon Alas M.D.   On: 06/08/2016 21:40    Procedures Procedures (including critical care time)  Medications Ordered in ED Medications  lacosamide (VIMPAT) 200 mg in sodium chloride 0.9 % 25 mL IVPB (0 mg Intravenous Stopped 06/08/16 2342)  levETIRAcetam (KEPPRA) tablet 1,500 mg (not administered)     Initial Impression / Assessment and Plan / ED Course  I have reviewed the triage vital signs and the nursing notes.  Pertinent labs & imaging results that were available during my care of the patient were reviewed by me and considered in my medical decision making (see chart for details).    Consult: Dr. Nicole Kindred neurology. Recommends Vimpat 200 mg IV load and resuming outpatient Vimpat dosing. Final Clinical Impressions(s) / ED Diagnoses   Final diagnoses:  Seizure disorder ([place])  Severe comorbid illness   Patient has seizure tonight. At this point there is no evident etiology. He had significant seizure's outlined at onset of his illness. This is a first seizure since his discharge from long-term hospitalization. There is no periods at this time of acute infectious illness. Patient had been discontinued from Vimpat approximately 2 months ago. He is taking Keppra. Per consultation with Dr. Nicole Kindred, patient was given an IV loading dose of Vimpat and will resume 200 mg twice daily that he had been taking.  Patient's wife is counseled on signs and symptoms first return and plan will be for neurology follow-up within the next week. New Prescriptions New Prescriptions   LACOSAMIDE (VIMPAT) 200 MG TABS TABLET    Take 1 tablet (200 mg total) by mouth 2 (two) times daily.     Charlesetta Shanks, MD 06/09/16 [phone]

## 2016-06-08 NOTE — Progress Notes (Signed)
DATE:  06/08/2016   MRN:  [id number]  BIRTHDAY: [birth year]  Facility:  Nursing Home Location:  [place] Room Number: 803-B  LEVEL OF CARE:  SNF (18)  Contact Information    Name Relation Home Work Mobile   Contact,No Other [phone]         Code Status History    This patient does not have a recorded code status. Please follow your organizational policy for patients in this situation.       Chief Complaint  Patient presents with  . Medical Management of Chronic Issues    HISTORY OF PRESENT ILLNESS:  This is a 39-YO male seen for a routine visit.  He is now a long-term rehabilitation resident of [facility]. BPs has been stable - 115/70, 132/71, 122/76.    PAST MEDICAL HISTORY:  Past Medical History:  Diagnosis Date  . Alcohol use   . Hyperlipidemia   . Hypertension   . Seizures ([place])   . Ulcerative colitis ([place])      CURRENT MEDICATIONS: Reviewed  Patient's Medications  New Prescriptions   No medications on file  Previous Medications   ACETAMINOPHEN (TYLENOL) 325 MG TABLET    Take 650 mg by mouth every 4 (four) hours as needed for mild pain or fever.    ACETAMINOPHEN (TYLENOL) 325 MG TABLET    Take 650 mg by mouth 2 (two) times daily. For 167 doses   AMMONIUM LACTATE (LAC-HYDRIN) 12 % LOTION    Apply 1 application topically 2 (two) times daily. Apply to hands and feet for dry skin for 167 doses   ATORVASTATIN (LIPITOR) 40 MG TABLET    Take 40 mg by mouth daily.   BISACODYL (DULCOLAX) 10 MG SUPPOSITORY    Place 10 mg rectally every other day.   CARVEDILOL (COREG) 12.5 MG TABLET    Take 12.5 mg by mouth 2 (two) times daily with a meal.    CLOTRIMAZOLE-BETAMETH & ZN OX 1-0.05 & 20 % THPK    Apply 1 application topically 2 (two) times daily.   DONEPEZIL (ARICEPT) 5 MG TABLET    Take 5 mg by mouth at bedtime.    LEVETIRACETAM (KEPPRA) 750 MG TABLET    Take 1,500 mg by mouth 2 (two) times daily. Per tube     LISINOPRIL (PRINIVIL,ZESTRIL) 5 MG TABLET    Place 5 mg into feeding tube daily.   MESALAMINE (LIALDA) 1.2 G EC TABLET    Take 2.4 g by mouth daily with breakfast.    MUPIROCIN CREAM (BACTROBAN) 2 %    Apply 1 application topically 2 (two) times daily. Apply to PEG site   NUTRITIONAL SUPPLEMENT LIQD    Take 120 mLs by mouth 3 (three) times daily.   NUTRITIONAL SUPPLEMENTS (FEEDING SUPPLEMENT, OSMOLITE 1.5 CAL,) LIQD    Place 237 mLs into feeding tube. Bolus 1 can if patient fails to consume <50% of his meal   PANTOPRAZOLE (PROTONIX) 40 MG TABLET    Take 40 mg by mouth daily. Per tube    SERTRALINE (ZOLOFT) 100 MG TABLET    Take 150 mg by mouth daily. Take 1-1/2 tablets to = 150 mg   THIAMINE 100 MG TABLET    Take 100 mg by mouth daily.    TRAZODONE (DESYREL) 50 MG TABLET    Take 75 mg by mouth at bedtime. Take 1-1/2 tablets to - 75 mg  Modified Medications   No medications on file  Discontinued Medications   GUAIFENESIN (ROBITUSSIN) 100 MG/5ML SYRUP    Take 200 mg by mouth every 4 (four) hours as needed for cough.     Allergies  Allergen Reactions  . Fluconazole      REVIEW OF SYSTEMS:  GENERAL: no change in appetite, no fatigue, no weight changes, no fever, chills or weakness EYES: Denies change in vision, dry eyes, eye pain, itching or discharge EARS: Denies change in hearing, ringing in ears, or earache NOSE: Denies nasal congestion or epistaxis MOUTH and THROAT: Denies oral discomfort, gingival pain or bleeding, pain from teeth or hoarseness   RESPIRATORY: no cough, SOB, DOE, wheezing, hemoptysis CARDIAC: no chest pain, edema or palpitations GI: no abdominal pain, diarrhea, constipation, heart burn, nausea or vomiting GU: Denies dysuria, frequency, hematuria, incontinence, or discharge PSYCHIATRIC: Denies feeling of depression or anxiety. No report of hallucinations, insomnia, paranoia, or agitation   PHYSICAL EXAMINATION  GENERAL APPEARANCE: Well nourished. In no acute  distress. Normal body habitus SKIN:  Skin is warm and dry.  HEAD: Normal in size and contour. No evidence of trauma EYES: Lids open and close normally. No blepharitis, entropion or ectropion. PERRL. Conjunctivae are clear and sclerae are white. Lenses are without opacity EARS: Pinnae are normal. Patient hears normal voice tunes of the examiner MOUTH and THROAT: Lips are without lesions. Oral mucosa is moist and without lesions. 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 Affect and behavior are appropriate   LABS/RADIOLOGY: Labs reviewed: Basic Metabolic Panel:  Recent Labs  04/06/16 04/27/16  NA 142 142  K 4.2 3.7  BUN 11 11  CREATININE 0.7 0.7   Liver Function Tests:  Recent Labs  04/06/16  AST 17  ALT 32  ALKPHOS 97   CBC:  Recent Labs  04/06/16 04/27/16  WBC 8.3 7.4  HGB 14.1 14.0  HCT 41 40*  PLT 298 298    ASSESSMENT/PLAN:  Hypertension -  well-controlled; continue Coreg 12.5 mg 1 tab every 12 hours and lisinopril 5 mg 1 tab daily  Seizure - continue Lacosamide  200 mg every 12 hours and Keppra 750 mg 2 tabs = 1500 mg every 12 hours  Ulcerative colitis - continue mesalamine DR 1.2 g 2 tabs = 2.4 gm daily  Depression - continue Sertraline 100 mg give 1 1/2 tab = 150 mg PO Q D  GERD - Continue Protonix 40 mg daily  Insomnia - continue Trazodone 50 mg give 1 1/2 tab = 75 mg PO Q HS  Hyperlipidemia - continue Lipitor 40 mg 1  tab PO Q HS  Dementia - continue Aricept 5 mg 1 tab daily at bedtime  Dysphagia - currently on mechanical soft  NAS diet and has bolus of osmolite 1.5 240 ml via PEG tube if meal intake < 50%;  aspiration precaution  Hx of alcohol use - continue Thiamin 100 mg 1 tab PO Q D     Goals of care:  Long-term care    Monina C. Medina-Vargas -  NP    [employer] [phone]

## 2016-06-09 LAB — URINALYSIS, ROUTINE W REFLEX MICROSCOPIC
Bilirubin Urine: NEGATIVE
GLUCOSE, UA: NEGATIVE mg/dL
HGB URINE DIPSTICK: NEGATIVE
Ketones, ur: 5 mg/dL — AB
LEUKOCYTES UA: NEGATIVE
Nitrite: NEGATIVE
Protein, ur: 30 mg/dL — AB
SPECIFIC GRAVITY, URINE: 1.026 (ref 1.005–1.030)
WBC, UA: NONE SEEN WBC/hpf (ref 0–5)
pH: 5 (ref 5.0–8.0)

## 2016-06-09 MED ORDER — LACOSAMIDE 200 MG PO TABS: 200.0000 mg | ORAL_TABLET | Freq: Two times a day (BID) | ORAL | 1 refills | Status: AC

## 2016-06-09 NOTE — ED Notes (Signed)
Report called to [address] and given to [facility]

## 2016-06-09 NOTE — Discharge Instructions (Signed)
Patient is to restart Vimpat.  Patient should have neurology follow-up ideally within 1 week or as soon as possible.

## 2016-06-09 NOTE — ED Notes (Signed)
[facility] called to transport pt back to [address].

## 2016-06-11 LAB — LEVETIRACETAM LEVEL: LEVETIRACETAM: 3.2 ug/mL — AB (ref 10.0–40.0)

## 2016-06-29 ENCOUNTER — Encounter: Payer: Self-pay | Admitting: Neurology

## 2016-06-29 ENCOUNTER — Ambulatory Visit (INDEPENDENT_AMBULATORY_CARE_PROVIDER_SITE_OTHER): Payer: 59 | Admitting: Neurology

## 2016-06-29 VITALS — BP 113/76 | HR 68

## 2016-06-29 DIAGNOSIS — K51919 Ulcerative colitis, unspecified with unspecified complications: Secondary | ICD-10-CM | POA: Diagnosis not present

## 2016-06-29 DIAGNOSIS — G931 Anoxic brain damage, not elsewhere classified: Secondary | ICD-10-CM

## 2016-06-29 DIAGNOSIS — G40909 Epilepsy, unspecified, not intractable, without status epilepticus: Secondary | ICD-10-CM | POA: Diagnosis not present

## 2016-06-29 MED ORDER — DONEPEZIL HCL 10 MG PO TABS: 10.0000 mg | ORAL_TABLET | Freq: Every day | ORAL | 4 refills | Status: DC

## 2016-06-29 NOTE — Progress Notes (Signed)
PATIENT: Alexander Henderson DOB: December 17, 1960  Chief Complaint  Patient presents with  . Seizures    He is here with his wife, [name].  He resides at [facility] in  [place]. His most recent seizure was 06/08/16 after not being given Keppra on a regular basis and discontinuation of his Vimpat.  He is now taking Keppra 1554m, BID and Vimpat 2054m BID.  No further seizure events.  . Anoxic encephalopathy    States this is secondary to his cardiac arrest on 01/02/17.     HISTORICAL  Alexander Henderson a 56ears old right-handed male, accompanied by his wife, seen in refer by his primary care physician Dr. PaBubba CampMahima to follow-up for anoxic brain injury, initial evaluation was on June 30 2016.  He had one generalized seizure in 2007, on January 02 2017, while he was at a football game with his wife, he did have excessive alcohol intake, then while he was sitting with his wife, he utterred " I am so sorry", then staring into space, slumped over, he hold his breath, turned blue, lost control of his bowel and bladder, EMS was called, he had prolonged resuscitation, CPR defibrillator at emergency room, [facility]-48 hours, while trying to rewarm him up, he was noted to have seizure activity on the EEG, was put back into burst suppression for 2 weeks, was treated with prolonged course of worse it, with 4 antiepileptic medications, MRI of the brain showed no significant abnormality, later under close supervision of continued EEG monitoring, his seizure medicine was gradually tapered to Vimpat 200 mg twice a day, Keppra 750 mg 2 tablets twice a day upon discharge to her rehabilitation center, he require prolonged rehabilitation, had PEG tube and tracheostomy, now he can eat by mouth, walk with assistance, talks slowly, but intelligently, some memory loss complains of fatigue,  But Vimpat was discontinued shortly afterwards, there was also  documentation that he refuse his seizure medications sometimes, he suffered a recurrent seizure on Jun 08 2016.  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 Fluconazole Rash    HOME MEDICATIONS: Current Outpatient Prescriptions  Medication Sig Dispense Refill  . acetaminophen (TYLENOL) 325 MG tablet Take 650 mg by mouth every 4 (four) hours as needed for mild pain or fever.     . Marland Kitchencetaminophen (TYLENOL) 325 MG tablet Take 650 mg by mouth 2 (two) times daily. For 167 doses    . ammonium lactate (LAC-HYDRIN) 12 % lotion Apply 1 application topically 2 (two) times daily. Apply to hands and feet for dry skin for 167 doses    . atorvastatin (LIPITOR) 40 MG tablet Take 40 mg by mouth daily.    .Marland Kitchen  bisacodyl (DULCOLAX) 10 MG suppository Place 10 mg rectally every other day.    . carvedilol (COREG) 12.5 MG tablet Take 12.5 mg by mouth 2 (two) times daily with a meal.     . Clotrimazole-Betameth & Zn Ox 1-0.05 & 20 % THPK Apply 1 application topically 2 (two) times daily.    Marland Kitchen donepezil (ARICEPT) 5 MG tablet Take 5 mg by mouth at bedtime.     Marland Kitchen lacosamide (VIMPAT) 200 MG TABS tablet Take 1 tablet (200 mg  total) by mouth 2 (two) times daily. 60 tablet 1  . levETIRAcetam (KEPPRA) 750 MG tablet Take 1,500 mg by mouth 2 (two) times daily. Per tube     . lisinopril (PRINIVIL,ZESTRIL) 5 MG tablet Place 5 mg into feeding tube daily.    . mesalamine (LIALDA) 1.2 g EC tablet Take 2.4 g by mouth daily with breakfast.     . mupirocin cream (BACTROBAN) 2 % Apply 1 application topically 2 (two) times daily. Apply to PEG site    . NUTRITIONAL SUPPLEMENT LIQD Take 120 mLs by mouth 3 (three) times daily.    . Nutritional Supplements (FEEDING SUPPLEMENT, OSMOLITE 1.5 CAL,) LIQD Place 237 mLs into feeding tube. Bolus 1 can if patient fails to consume <50% of his meal    . pantoprazole (PROTONIX) 40 MG tablet 40 mg daily.     . sertraline (ZOLOFT) 100 MG tablet Take 150 mg by mouth daily. Take 1-1/2 tablets to = 150 mg    . thiamine 100 MG tablet Take 100 mg by mouth daily.     . traZODone (DESYREL) 50 MG tablet Take 75 mg by mouth at bedtime. Take 1-1/2 tablets to - 75 mg     No current facility-administered medications for this visit.     PAST MEDICAL HISTORY: Past Medical History:  Diagnosis Date  . Alcohol use   . Anoxic encephalopathy ([place])    secondary to cardiac arrest on 01/03/16.  Marland Kitchen Hyperlipidemia   . Hypertension   . Seizures ([place])   . Ulcerative colitis ([place])     PAST SURGICAL HISTORY: Past Surgical History:  Procedure Laterality Date  . FINGER SURGERY     partial amputation of right middle finger  . testical surgery     removal  . VASECTOMY      FAMILY HISTORY: Family History  Problem Relation Age of Onset  . Adopted: Yes    SOCIAL HISTORY:  Social History   Social History  . Marital status: Married    Spouse name: N/A  . Number of children: 4  . Years of education: some college   Occupational History  . Disabled    Social History Main Topics  . Smoking status: Never Smoker  . Smokeless tobacco: Never Used  . Alcohol use No     Comment: Stopped use on 01/03/16  .  Drug use: No  . Sexual activity: Not on file   Other Topics Concern  . Not on file   Social History Narrative   Lives in [place] in [place].   Right-handed.   No caffeine use.     PHYSICAL EXAM   Vitals:   06/29/16 0950  BP: 113/76  Pulse: 68    Not recorded      There is no height or weight on file to calculate BMI.  PHYSICAL EXAMNIATION:  Gen: NAD, conversant, well nourised, obese, well groomed  Cardiovascular: Regular rate rhythm, no peripheral edema, warm, nontender. Eyes: Conjunctivae clear without exudates or hemorrhage Neck: Supple, no carotid bruits. Pulmonary: Clear to auscultation bilaterally   NEUROLOGICAL EXAM:  MENTAL STATUS: Speech:     Slow deliberate talking, follow commands Cognition:     Orientation to time, place and person     Normal recent and remote memory     Normal Attention span and concentration     Normal Language, naming, repeating,spontaneous speech     Fund of knowledge   CRANIAL NERVES: CN II: Visual fields are full to confrontation. Fundoscopic exam is normal with sharp discs and no vascular changes. Pupils are round equal and briskly reactive to light. CN III, IV, VI: extraocular movement are normal. No ptosis. CN V: Facial sensation is intact to pinprick in all 3 divisions bilaterally. Corneal responses are intact.  CN VII: Face is symmetric with normal eye closure and smile. CN VIII: Hearing is normal to rubbing fingers CN IX, X: Palate elevates symmetrically. Phonation is normal. CN XI: Head turning and shoulder shrug are intact CN XII: Tongue is midline with normal movements and no atrophy.  MOTOR: Motor moderate spasticity of bilateral upper lower extremity, slow rigid antigravity movement,  REFLEXES: Reflexes are 2+ and symmetric at the biceps, triceps, knees, and ankles. Plantar responses are flexor.  SENSORY: Intact to light touch, pinprick, positional sensation and vibratory sensation  are intact in fingers and toes.  COORDINATION: Rapid alternating movements and fine finger movements are intact. There is no dysmetria on finger-to-nose and heel-knee-shin.    GAIT/STANCE: Posture is normal. Gait is steady with normal steps, base, arm swing, and turning. Heel and toe walking are normal. Tandem gait is normal.  Romberg is absent.   DIAGNOSTIC DATA (LABS, IMAGING, TESTING) - I reviewed patient records, labs, notes, testing and imaging myself where available.   ASSESSMENT AND PLAN  Alexander Henderson is a 56 y.o. male   hypoxic encephalopathy on January 03 2016 History of seizure,  Generalized seizure in 2007, prolonged status epilepticus following hypoxic injury, may be the initial event was due to a prolonged seizure  We will keep him on current dose of Vimpat 200 mg twice a day, Keppra 750 mg 2 tablets twice a day  Repeat MRI of the brain  EEG   Dysphagia  Swallowing evaluation    Marcial Pacas, M.D. Ph.D.  [facility] [address] [place], [place] [address] Ph: [phone] Fax: [phone]  CC: Blanchie Serve, MD

## 2016-07-05 ENCOUNTER — Other Ambulatory Visit (HOSPITAL_COMMUNITY): Payer: Self-pay | Admitting: Neurology

## 2016-07-05 DIAGNOSIS — R1310 Dysphagia, unspecified: Secondary | ICD-10-CM

## 2016-07-08 ENCOUNTER — Telehealth: Payer: Self-pay | Admitting: Neurology

## 2016-07-08 NOTE — Telephone Encounter (Signed)
Is the patient being sedated? Or just needing something to calm his nerves. Because if he just needs medicine to calm his nerves then he can have it at GI.

## 2016-07-08 NOTE — Telephone Encounter (Signed)
Pt's wife said he will need medication to take prior to MRI. GI advised her he could not be seen there if he needed medication, it would need to be done at the hospital. Please advise

## 2016-07-09 NOTE — Telephone Encounter (Signed)
Please find out detail, it is OK to give him xanax 46m prn for MRI

## 2016-07-12 ENCOUNTER — Other Ambulatory Visit: Payer: Self-pay | Admitting: *Deleted

## 2016-07-12 ENCOUNTER — Telehealth: Payer: Self-pay | Admitting: *Deleted

## 2016-07-12 MED ORDER — ALPRAZOLAM 1 MG PO TABS: ORAL_TABLET | ORAL | 0 refills | Status: DC

## 2016-07-12 NOTE — Telephone Encounter (Signed)
Spoke to patient's wife on HIPAA - Dr. Krista Blue needs records from his hospitalization at [facility] in [place], [place].  His wife has requested the records but we have not received them yet.

## 2016-07-12 NOTE — Telephone Encounter (Signed)
Spoke to his wife and he just needs oral sedation.  Dr. Krista Blue has provided Xanax prescription.  It has been faxed to [facility] (Ph: [phone], Fax: [phone]).  Rx marked attention: charge nurse on duty.  MRI can be scheduled now.

## 2016-07-12 NOTE — Telephone Encounter (Signed)
Noted, thank you

## 2016-07-13 ENCOUNTER — Ambulatory Visit (HOSPITAL_COMMUNITY)
Admission: RE | Admit: 2016-07-13 | Discharge: 2016-07-13 | Disposition: A | Discharge status: Home / Self Care | Payer: 59 | Source: Ambulatory Visit | Attending: Neurology | Admitting: Neurology

## 2016-07-13 ENCOUNTER — Telehealth: Payer: Self-pay | Admitting: Neurology

## 2016-07-13 DIAGNOSIS — R1310 Dysphagia, unspecified: Secondary | ICD-10-CM

## 2016-07-13 DIAGNOSIS — R569 Unspecified convulsions: Secondary | ICD-10-CM | POA: Insufficient documentation

## 2016-07-13 DIAGNOSIS — G40909 Epilepsy, unspecified, not intractable, without status epilepticus: Secondary | ICD-10-CM | POA: Insufficient documentation

## 2016-07-13 DIAGNOSIS — G931 Anoxic brain damage, not elsewhere classified: Secondary | ICD-10-CM | POA: Insufficient documentation

## 2016-07-13 DIAGNOSIS — K51919 Ulcerative colitis, unspecified with unspecified complications: Secondary | ICD-10-CM | POA: Insufficient documentation

## 2016-07-13 NOTE — Telephone Encounter (Signed)
Pt wife called back, asking to be called back at [phone]

## 2016-07-13 NOTE — Telephone Encounter (Signed)
Spoke to patient - she is aware of the results.  She is going to speak to the PCP at [facility] to see if the patient's PEG tube can be removed.

## 2016-07-13 NOTE — Telephone Encounter (Signed)
Left his wife a detailed voicemail with results (ok per DPR).  Provided our number to call back with any questions.  Test results have also been faxed to [facility] at [phone].

## 2016-07-13 NOTE — Telephone Encounter (Signed)
Please call patient, swallowing study showed no significant dysfunctioning at his current swallowing status, he may continue diet as he can tolerate    Pt has a functional oropharyngeal swallow with no aspiration or penetration observed across challenging with various textures and even mixed consistencies. Would continue any PO texture as toelrated without restrictions.

## 2016-07-13 NOTE — Progress Notes (Signed)
Modified Barium Swallow Progress Note  Patient Details  Name: Venus Ruhe MRN: [id number] Date of Birth: 07-12-1960  Today's Date: 07/13/2016  Modified Barium Swallow completed.  Full report located under Chart Review in the Imaging Section.  Brief recommendations include the following:  Clinical Impression  Pt has a functional oropharyngeal swallow with no aspiration or penetration observed across challenging with various textures and even mixed consistencies. Would continue any PO texture as toelrated without restrictions.    Swallow Evaluation Recommendations       SLP Diet Recommendations: Regular solids;Thin liquid   Liquid Administration via: Cup;Straw   Medication Administration: Whole meds with liquid   Supervision: Patient able to self feed;Intermittent supervision to cue for compensatory strategies;Staff to assist with self feeding   Compensations: Slow rate;Small sips/bites   Postural Changes: Remain semi-upright after after feeds/meals (Comment);Seated upright at 90 degrees   Oral Care Recommendations: Oral care BID        Germain Osgood 07/13/2016,12:34 PM   Germain Osgood, M.A. CCC-SLP [phone]

## 2016-07-23 ENCOUNTER — Telehealth: Payer: Self-pay | Admitting: Neurology

## 2016-07-23 NOTE — Telephone Encounter (Signed)
Spoke to wife.  Pt had sz last night around 845pm, when getting ready for bed, in the bathroom with attendant. He was in wheelchair.  Did not hurt himself.   Some confusion this morning per wife. Per staff, meds may not have been given at specific times.  Psychiatry had seen pt and discontinued aricept, and trazadone, started dilantin.  She is unsure of dose.  [place] is suppose to call us and give [place] update.  Wife has spoken to DON and relayed they are not to mess with neuro drugs and would like to have aricept restarted.  Asked about EEG to be done on Tuesday and I told her will keep scheduled as planned.  She will let us know on Monday.  Dr. Krista Blue out of office, sent to [facility].

## 2016-07-23 NOTE — Telephone Encounter (Signed)
Patients wife called office in reference to patient to having seizure last night about 8:45pm at [facility] in [place].  Lasted about a minute in wheelchair patient did not fall.  Patients wife would like to make sure we are aware of what happened and guidance if patient is needing to follow thru with EEG on Tuesday.  Please call

## 2016-07-27 ENCOUNTER — Ambulatory Visit (INDEPENDENT_AMBULATORY_CARE_PROVIDER_SITE_OTHER): Payer: 59 | Admitting: Diagnostic Neuroimaging

## 2016-07-27 DIAGNOSIS — G40909 Epilepsy, unspecified, not intractable, without status epilepticus: Secondary | ICD-10-CM

## 2016-07-27 DIAGNOSIS — K51919 Ulcerative colitis, unspecified with unspecified complications: Secondary | ICD-10-CM

## 2016-07-27 DIAGNOSIS — G931 Anoxic brain damage, not elsewhere classified: Secondary | ICD-10-CM

## 2016-07-29 NOTE — Procedures (Signed)
   [facility]  EEG (ELECTROENCEPHALOGRAM) REPORT   STUDY DATE: 07/27/16 PATIENT NAME: Alexander Henderson DOB: February 19, 1960 MRN: [id number]  ORDERING CLINICIAN: Marcial Pacas, MD PhD   TECHNOLOGIST: Oneita Jolly TECHNIQUE: Electroencephalogram was recorded utilizing standard 10-20 system of lead placement and reformatted into average and bipolar montages.  RECORDING TIME: 20 mintues ACTIVATION: photic stimulation  CLINICAL INFORMATION: 56 year old male with anoxic brain injury and seizures.  FINDINGS: Background rhythms of 6-7 hertz and 20-30 microvolts. Frequent independent left and right hemisphere spikes and sharp waves. No seizures are seen. Patient recorded in the awake and drowsy state. EKG channel shows 55-60 beats per minute.  IMPRESSION:  Abnormal EEG in the awake state demonstrating: 1. Moderate slowing consistent with moderate encephalopathy. 2. Frequent independent left and right hemisphere spikes and sharp waves.  3. No electrographic seizures are seen.     INTERPRETING PHYSICIAN:  Penni Bombard, MD Certified in Neurology, Neurophysiology and Neuroimaging  [facility] [address] [place], [place] [address] [phone]

## 2016-08-02 ENCOUNTER — Telehealth: Payer: Self-pay | Admitting: *Deleted

## 2016-08-02 NOTE — Telephone Encounter (Signed)
-----   Message from Star Age, MD sent at 08/02/2016  4:05 PM EDT ----- Recent EEG showed intermittent changes in keeping with lowered seizure threshold or propensity towards seizures, and generalized slowing of the brain waves, in keeping with more nonspecific dysfunction of the brain, in keeping with his prior history of seizures and anoxic brain injury. No active seizures were seen on the EEG. Please notify patient or his wife. Treatment plan and follow-up as per Dr. Krista Blue.  Star Age, MD, PhD [facility])

## 2016-08-02 NOTE — Progress Notes (Signed)
Recent EEG showed intermittent changes in keeping with lowered seizure threshold or propensity towards seizures, and generalized slowing of the brain waves, in keeping with more nonspecific dysfunction of the brain, in keeping with his prior history of seizures and anoxic brain injury. No active seizures were seen on the EEG. Please notify patient or his wife. Treatment plan and follow-up as per Dr. Krista Blue.  Alexander Age, MD, PhD [facility])

## 2016-08-02 NOTE — Telephone Encounter (Signed)
Left message for his wife, [name] (on HIPAA), to return my call.

## 2016-08-03 NOTE — Telephone Encounter (Signed)
Patient's wife [name] is returning your call.

## 2016-08-03 NOTE — Telephone Encounter (Signed)
Spoke to his wife, [name] - she is aware of these results and verbalized understanding.

## 2016-08-09 ENCOUNTER — Telehealth: Payer: Self-pay | Admitting: Neurology

## 2016-08-09 NOTE — Telephone Encounter (Signed)
I was able to review the record from [facility] admission date January 03 2016, discharge February 08 2016  CT head without contrast January 06 2016, ventricle and sulcal prominent, most compatible with cerebral volume loss, which is somewhat advanced for age, endotracheal and nasogastric tubes are partially visualized, fluid in the nasal cavity and nasopharynx, presumably related to intubation.  MRI of the brain without contrast on January 15 2016, no intracranial hemorrhage, no acute infarction no mass, scattered T2/FLAIR signal hyperintensity in the periventricular and subcortical white matter, nonspecific, compatible with mild microangiopathic changes, bilateral mastoid cell opacification, diffuse paranasal sinus mucosal thickening, complete opacification of the left sphenoid sinus,  CT head without contrast January 20 2016, opacification of the left sphenoid sinus, which has progressed compared to prior areas CT, no acute intracranial abnormalities  Video EEG January 14 2016 evidence of moderate diffuse/multifocal cerebral dysfunction which is in part due to pharmacological sedation, no significant change  Video EEG January 15 2016, moderate diffuse background slowing, frequent central sharp waves, video EEG January 16 2016 moderate diffuse background slowing, frequent central sharp waves,  January 21 2016, moderate diffuse slowing, occasionally central sharp waves,  January 21 2016, moderate diffuse background slowing, occasionally central sharp waves,  January 30 2016, diffuse slowing, theater with intermixed with delta, fragment of irregular 7-8 Hz posterior dominant activity

## 2016-08-10 ENCOUNTER — Telehealth: Payer: Self-pay | Admitting: *Deleted

## 2016-08-10 NOTE — Telephone Encounter (Signed)
R/c medical records from [facility].

## 2016-08-12 ENCOUNTER — Ambulatory Visit
Admission: RE | Admit: 2016-08-12 | Discharge: 2016-08-12 | Disposition: A | Discharge status: Home / Self Care | Payer: 59 | Source: Ambulatory Visit | Attending: Neurology | Admitting: Neurology

## 2016-08-12 DIAGNOSIS — G931 Anoxic brain damage, not elsewhere classified: Secondary | ICD-10-CM

## 2016-08-12 DIAGNOSIS — G40909 Epilepsy, unspecified, not intractable, without status epilepticus: Secondary | ICD-10-CM | POA: Diagnosis not present

## 2016-08-12 DIAGNOSIS — K51919 Ulcerative colitis, unspecified with unspecified complications: Secondary | ICD-10-CM

## 2016-08-12 MED ORDER — GADOBENATE DIMEGLUMINE 529 MG/ML IV SOLN
17.0000 mL | Freq: Once | INTRAVENOUS | Status: AC | PRN
  Administered 2016-08-12: 17 mL via INTRAVENOUS

## 2016-08-20 ENCOUNTER — Other Ambulatory Visit (HOSPITAL_COMMUNITY): Payer: Self-pay | Admitting: Internal Medicine

## 2016-08-20 DIAGNOSIS — R1319 Other dysphagia: Secondary | ICD-10-CM

## 2016-08-24 ENCOUNTER — Ambulatory Visit (HOSPITAL_COMMUNITY)
Admission: RE | Admit: 2016-08-24 | Discharge: 2016-08-24 | Disposition: A | Discharge status: Home / Self Care | Payer: 59 | Source: Ambulatory Visit | Attending: Internal Medicine | Admitting: Internal Medicine

## 2016-08-24 ENCOUNTER — Emergency Department (HOSPITAL_COMMUNITY): Payer: 59

## 2016-08-24 ENCOUNTER — Emergency Department (HOSPITAL_COMMUNITY)
Admission: EM | Admit: 2016-08-24 | Discharge: 2016-08-24 | Disposition: A | Discharge status: Home / Self Care | Payer: 59 | Attending: Emergency Medicine | Admitting: Emergency Medicine

## 2016-08-24 ENCOUNTER — Encounter (HOSPITAL_COMMUNITY): Payer: Self-pay | Admitting: Interventional Radiology

## 2016-08-24 DIAGNOSIS — Y9210 Unspecified residential institution as the place of occurrence of the external cause: Secondary | ICD-10-CM | POA: Insufficient documentation

## 2016-08-24 DIAGNOSIS — Z79899 Other long term (current) drug therapy: Secondary | ICD-10-CM | POA: Diagnosis not present

## 2016-08-24 DIAGNOSIS — W19XXXA Unspecified fall, initial encounter: Secondary | ICD-10-CM

## 2016-08-24 DIAGNOSIS — S0003XA Contusion of scalp, initial encounter: Secondary | ICD-10-CM | POA: Insufficient documentation

## 2016-08-24 DIAGNOSIS — Y9389 Activity, other specified: Secondary | ICD-10-CM | POA: Diagnosis not present

## 2016-08-24 DIAGNOSIS — Z431 Encounter for attention to gastrostomy: Secondary | ICD-10-CM | POA: Insufficient documentation

## 2016-08-24 DIAGNOSIS — Z23 Encounter for immunization: Secondary | ICD-10-CM | POA: Diagnosis not present

## 2016-08-24 DIAGNOSIS — Y999 Unspecified external cause status: Secondary | ICD-10-CM | POA: Insufficient documentation

## 2016-08-24 DIAGNOSIS — W01190A Fall on same level from slipping, tripping and stumbling with subsequent striking against furniture, initial encounter: Secondary | ICD-10-CM | POA: Diagnosis not present

## 2016-08-24 DIAGNOSIS — R1319 Other dysphagia: Secondary | ICD-10-CM

## 2016-08-24 DIAGNOSIS — I10 Essential (primary) hypertension: Secondary | ICD-10-CM | POA: Diagnosis not present

## 2016-08-24 DIAGNOSIS — S0990XA Unspecified injury of head, initial encounter: Secondary | ICD-10-CM | POA: Diagnosis present

## 2016-08-24 HISTORY — PX: IR GASTROSTOMY TUBE REMOVAL: IMG5492

## 2016-08-24 MED ORDER — TETANUS-DIPHTH-ACELL PERTUSSIS 5-2.5-18.5 LF-MCG/0.5 IM SUSP
0.5000 mL | Freq: Once | INTRAMUSCULAR | Status: AC
  Administered 2016-08-24: 0.5 mL via INTRAMUSCULAR
  Filled 2016-08-24: qty 0.5

## 2016-08-24 MED ORDER — OXYCODONE-ACETAMINOPHEN 5-325 MG PO TABS
1.0000 | ORAL_TABLET | Freq: Once | ORAL | Status: AC
  Administered 2016-08-24: 1 via ORAL
  Filled 2016-08-24: qty 1

## 2016-08-24 MED ORDER — LIDOCAINE VISCOUS 2 % MT SOLN
OROMUCOSAL | Status: AC
  Administered 2016-08-24: 8 mL
  Filled 2016-08-24: qty 15

## 2016-08-24 MED ORDER — LACOSAMIDE 50 MG PO TABS
200.0000 mg | ORAL_TABLET | Freq: Once | ORAL | Status: AC
  Administered 2016-08-24: 200 mg via ORAL
  Filled 2016-08-24: qty 4

## 2016-08-24 NOTE — Discharge Instructions (Signed)
Imaging studies today were negative for acute injuries. Follow-up with your primary care doctor. Return here for any new/worsening symptoms.

## 2016-08-24 NOTE — ED Provider Notes (Signed)
[facility]PT Provider Note   CSN: [id number] Arrival date & time: 08/24/16  1748     History   Chief Complaint Chief Complaint  Patient presents with  . Fall    HPI Alexander Henderson is a 56 y.o. male.  The history is provided by the patient and medical records.  Fall      LEVEL V CAVEAT: ANOXIC BRAIN INJURY 56 year old male with history of alcohol abuse, hyperlipidemia, hypertension, seizure disorder, ulcerative colitis, presenting to the ED after a fall. Patient resides at [facility] facility.  They have been working on his transfers and he has started walking some on his own.  Today he was trying to get out of his chair today and he fell striking his head on the bedside table. Unsure of loss of consciousness.  Patient was transported here for evaluation of injuries. He has a small wound to the right side of his scalp without active bleeding. Has a few other minor abrasions.  Date of last tetanus unknown at this time.  Patient's wife apparently on the way to ED, will speak with her on arrival for further history.  Past Medical History:  Diagnosis Date  . Alcohol use   . 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  Marland Kitchen Hyperlipidemia   . Hypertension   . Seizures ([place])   . Ulcerative colitis [facility])     Patient Active Problem List   Diagnosis Date Noted  . Anoxic encephalopathy ([place]) 04/07/2016  . Dysphagia 04/07/2016  . Essential hypertension 04/07/2016  . Seizure disorder ([place]) 04/07/2016  . Gastroesophageal reflux disease 04/07/2016  . Depression 04/07/2016  . History of alcohol use 04/07/2016  . Ulcerative colitis with complication ([place]) 12/87/8676  . Hyperlipidemia 04/07/2016    Past Surgical History:  Procedure Laterality Date  . FINGER SURGERY     partial amputation of right middle finger  . IR GASTROSTOMY TUBE REMOVAL  08/24/2016  . testical surgery     removal  . VASECTOMY         Home Medications    Prior  to Admission medications   Medication Sig Start Date End Date Taking? Authorizing Provider  acetaminophen (TYLENOL) 325 MG tablet Take 650 mg by mouth every 4 (four) hours as needed for mild pain or fever.     [provider]  ALPRAZolam Duanne Moron) 1 MG tablet Take two tablets by mouth thirty minutes prior to MRI.  May take one additional tablet by mouth before entering scanner, if needed.  MUST HAVE DRIVER. 07/12/16   Marcial Pacas, MD  ammonium lactate (LAC-HYDRIN) 12 % lotion Apply 1 application topically 2 (two) times daily. Apply to hands and feet for dry skin for 167 doses    [provider]  atorvastatin (LIPITOR) 40 MG tablet Take 40 mg by mouth daily.    [provider]  bisacodyl (DULCOLAX) 10 MG suppository Place 10 mg rectally every other day.    [provider]  carvedilol (COREG) 12.5 MG tablet Take 12.5 mg by mouth 2 (two) times daily with a meal.     [provider]  donepezil (ARICEPT) 10 MG tablet Take 1 tablet (10 mg total) by mouth at bedtime. 06/29/16   Marcial Pacas, MD  lacosamide (VIMPAT) 200 MG TABS tablet Take 1 tablet (200 mg total) by mouth 2 (two) times daily. 06/09/16   Charlesetta Shanks, MD  levETIRAcetam (KEPPRA) 750 MG tablet Take 1,500 mg by mouth 2 (two) times daily.  Per tube     [provider]  lisinopril (PRINIVIL,ZESTRIL) 5 MG tablet Place 5 mg into feeding tube daily.    [provider]  mesalamine (LIALDA) 1.2 g EC tablet Take 2.4 g by mouth daily with breakfast.     [provider]  mupirocin cream (BACTROBAN) 2 % Apply 1 application topically 2 (two) times daily. Apply to PEG site    [provider]  pantoprazole (PROTONIX) 40 MG tablet 40 mg daily.     [provider]  sertraline (ZOLOFT) 100 MG tablet Take 150 mg by mouth daily. Take 1-1/2 tablets to = 150 mg    [provider]  thiamine 100 MG tablet Take 100 mg by mouth daily.     [provider]  traZODone  (DESYREL) 50 MG tablet Take 75 mg by mouth at bedtime. Take 1-1/2 tablets to - 75 mg    [provider]    Family History Family History  Problem Relation Age of Onset  . Adopted: Yes    Social History Social History  Substance Use Topics  . Smoking status: Never Smoker  . Smokeless tobacco: Never Used  . Alcohol use No     Comment: Stopped use on 01/03/16     Allergies   Fluconazole   Review of Systems Review of Systems  Unable to perform ROS: Other  All other systems reviewed and are negative.    Physical Exam Updated Vital Signs BP 110/76 (BP Location: Right Arm)   Pulse 88   Temp 98.1 F (36.7 C) (Oral)   Resp 17   SpO2 98%   Physical Exam  Constitutional: He appears well-developed and well-nourished.  HENT:  Head: Normocephalic and atraumatic.  Mouth/Throat: Oropharynx is clear and moist.  1cm abrasion to right parieto/occipital scalp; there is no open wound or active bleeding; small area of contusion noted; locally tender, no skull depression, no facial injuries noted  Eyes: Pupils are equal, round, and reactive to light. Conjunctivae and EOM are normal.  Neck: Normal range of motion.  Cardiovascular: Normal rate, regular rhythm and normal heart sounds.   Pulmonary/Chest: Effort normal and breath sounds normal.  Abdominal: Soft. Bowel sounds are normal.  Musculoskeletal: Normal range of motion.  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  Alert, seems aware of surroundings, able to answer some questions and follow some commands when prompted, speech is slow but mostly appropriate and goal oriented    Skin: Skin is warm and dry.  Psychiatric: He has a normal mood and affect.  Nursing note and vitals reviewed.    ED Treatments / Results  Labs (all labs ordered are listed, but only abnormal results are displayed) Labs Reviewed - No data to display  EKG  EKG Interpretation None       Radiology Dg Pelvis 1-2 Views  Result Date: 08/24/2016 CLINICAL DATA:  Fall, right hip pain EXAM: PELVIS - 1-2 VIEW COMPARISON:  None. FINDINGS: No fracture or dislocation is seen. Bilateral hip joint spaces are preserved. Visualized bony pelvis appears intact. IMPRESSION: Negative. Electronically Signed   By: Julian Hy M.D.   On: 08/24/2016 19:02  Ct Head Wo Contrast  Result Date: 08/24/2016 CLINICAL DATA:  56 year old male status post fall at skilled nursing facility striking head on table. Dizziness. EXAM: CT HEAD WITHOUT CONTRAST TECHNIQUE: Contiguous axial images were obtained from the base of the skull through the vertex without intravenous contrast. COMPARISON:  Brain MRI 08/12/2016 FINDINGS: Brain: Stable cerebral volume from the recent MRI. No midline shift, ventriculomegaly, mass effect, evidence of mass lesion, intracranial hemorrhage or evidence of cortically based acute infarction. Gray-white matter differentiation is within normal limits throughout the brain. Vascular: No suspicious intracranial vascular hyperdensity. Skull: Osteopenia. No skull fracture or acute osseous abnormality identified. Sinuses/Orbits: Visualized paranasal sinuses and mastoids are stable and well pneumatized. Other: Right posterolateral convexity broad-based scalp soft tissue swelling up to 8 mm in thickness. No underlying calvarium fracture. Other scalp and orbits soft tissues are within normal limits. IMPRESSION: 1. Right scalp soft tissue injury without underlying fracture. 2. Negative non contrast CT appearance of the brain. Electronically Signed   By: Genevie Ann M.D.   On: 08/24/2016 18:43   Dg Hand Complete  Right  Result Date: 08/24/2016 CLINICAL DATA:  Fall EXAM: RIGHT HAND - COMPLETE 3+ VIEW COMPARISON:  None. FINDINGS: Amputation of the 3rd digit at the level of the proximal phalanx just prior to the PIP joint. No fracture or dislocation is seen. The joint spaces are preserved. Visualized soft tissues are within normal limits. IMPRESSION: No acute osseus abnormality is seen. Electronically Signed   By: Julian Hy M.D.   On: 08/24/2016 19:01    Procedures Procedures (including critical care time)  Medications Ordered in ED Medications - No data to display   Initial Impression / Assessment and Plan / ED Course  I have reviewed the triage vital signs and the nursing notes.  Pertinent labs & imaging results that were available during my care of the patient were reviewed by me and considered in my medical decision making (see chart for details).  56 year old male here from [place] place after a fall. He struck his head against the corner of bedside table. Unknown loss of consciousness. He does have an abrasion to his right parietal/occipital scalp.  Complains of some mild hip tenderness and has an abrasion of right hand. No acute deformities noted. No apparent difficulty with range of motion. Will obtain imaging studies.  7:18 PM Patient's wife now bedside. I have reviewed imaging studies with her which were all negative, she reports she is relieved as he has no significant injuries. She cannot recall the date of his last tetanus will be given here.  She also reports he missed one of his seizure medicines as he was at the hospital, will order dose of his nighttime Vimpat here. Can likely discharge home soon.  Wounds cleansed and dressed. Tetanus given.  Pain improved after oral Percocet. Patient is stable for discharge back to his facility.  Can follow-up with PCP. Plan discussed with patient's wife, she acknowledged understanding. Return precautions were given for any new or worsening  symptoms.  Final Clinical Impressions(s) / ED Diagnoses   Final diagnoses:  Fall, initial encounter  Contusion of scalp, initial encounter    New Prescriptions Discharge Medication List as of 08/24/2016  8:02 PM       Larene Pickett, PA-C 08/24/16 2056    Carmin Muskrat, MD 08/25/16 [phone]

## 2016-08-24 NOTE — ED Notes (Signed)
Bed: [facility] Expected date:  Expected time:  Means of arrival:  Comments: EMS 56 y/o fall, from facility

## 2016-08-24 NOTE — ED Triage Notes (Signed)
Per EMS: Pt is coming [place] Pt fell trying to get out of bed. Pt struck the back right side of his head on the wheel of bedside table. Not bleeding at this time. No LOC No blood thinners Facility wants pt to be evaluated. Pt is alert to self and place but not time

## 2016-09-01 ENCOUNTER — Ambulatory Visit (INDEPENDENT_AMBULATORY_CARE_PROVIDER_SITE_OTHER): Payer: 59 | Admitting: Neurology

## 2016-09-01 ENCOUNTER — Encounter: Payer: Self-pay | Admitting: Neurology

## 2016-09-01 VITALS — BP 99/67 | HR 75 | Ht 70.0 in

## 2016-09-01 DIAGNOSIS — G931 Anoxic brain damage, not elsewhere classified: Secondary | ICD-10-CM

## 2016-09-01 DIAGNOSIS — R569 Unspecified convulsions: Secondary | ICD-10-CM | POA: Diagnosis not present

## 2016-09-01 DIAGNOSIS — R269 Unspecified abnormalities of gait and mobility: Secondary | ICD-10-CM

## 2016-09-01 MED ORDER — CARBIDOPA-LEVODOPA 25-100 MG PO TABS: 1.0000 | ORAL_TABLET | Freq: Three times a day (TID) | ORAL | 6 refills | Status: DC

## 2016-09-01 NOTE — Progress Notes (Signed)
PATIENT: Alexander Henderson DOB: [birth year]  Chief Complaint  Patient presents with  . Follow-up  . Seizures    had one other breakthru sz since last seen.       HISTORICAL  Alexander Henderson is a 56 years old right-handed male, accompanied by his wife, seen in refer by his primary care physician Dr. Bubba Camp, [place] to follow-up for anoxic brain injury, initial evaluation was on June 30 2016.  He had one generalized seizure in 2007, on January 02 2017, while he was at a football game with his wife, he did have excessive alcohol intake, then while he was sitting with his wife, he utterred " I am so sorry", then staring into space, slumped over, he hold his breath, turned blue, lost control of his bowel and bladder, EMS was called, he had prolonged resuscitation, CPR defibrillator at emergency room, this was treated at [place], he was put on hypothermia for 24-48 hours, while trying to rewarm him up, he was noted to have seizure activity on the EEG, was put back into burst suppression for 2 weeks, was treated with prolonged course of worse it, with 4 antiepileptic medications, MRI of the brain showed no significant abnormality, later under close supervision of continued EEG monitoring, his seizure medicine was gradually tapered to Vimpat 200 mg twice a day, Keppra 750 mg 2 tablets twice a day upon discharge to her rehabilitation center, he require prolonged rehabilitation, had PEG tube and tracheostomy, now he can eat by mouth, walk with assistance, talks slowly, but intelligently, some memory loss complains of fatigue,  But Vimpat was discontinued shortly afterwards, there was also documentation that he refuse his seizure medications sometimes, he suffered a recurrent seizure on Jun 08 2016.  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 Would continue any PO texture as toelrated without restrictions.   I was able to review the record from [facility] admission date January 03 2016, discharge February 08 2016  CT head without contrast January 06 2016, ventricle and sulcal prominent, most compatible with cerebral volume loss, which is somewhat advanced for age, endotracheal and nasogastric tubes are partially visualized, fluid in the nasal cavity and nasopharynx, presumably related to intubation.  MRI of the brain without contrast on January 15 2016, no intracranial hemorrhage, no acute infarction no mass, scattered T2/FLAIR signal hyperintensity in the periventricular and subcortical white matter, nonspecific, compatible with mild microangiopathic changes, bilateral mastoid cell opacification, diffuse paranasal sinus mucosal thickening, complete opacification of the left sphenoid sinus,  CT head without contrast January 20 2016, opacification of the left sphenoid sinus, which has progressed compared to prior areas CT, no acute intracranial abnormalities  Video EEG January 14 2016 evidence of moderate diffuse/multifocal cerebral dysfunction which is in  part due to pharmacological sedation, no significant change  Video EEG January 15 2016, moderate diffuse background slowing, frequent  central sharp waves, video EEG January 16 2016 moderate diffuse background slowing, frequent central sharp waves,  January 21 2016, moderate diffuse slowing, occasionally central sharp waves,  January 21 2016, moderate diffuse background slowing, occasionally central sharp waves,  January 30 2016, diffuse slowing, theater with intermixed with delta, fragment of irregular 7-8 Hz posterior dominant activity  He has seizure on June 28th 2018 at 845pm,  was witnessed by [employer], lasting for one mintue, he had no recall of the event,  wife attributed to the recurrent seizure to stopping of the Aricept  Per his wife Keppra, vimpat levels were therapeutic,   Reviewed his medication list, Depakote 250 mg twice a day, Aricept 10 mg daily, Vimpat 200 mg twice a day, Zoloft 100 mg 1 and half tablets daily, protonic 40 mg daily, Keppra 750 mg 2 tablets twice a day, thiamine 100 mg daily, Tylenol as needed, Dulcolax 10 mg suppository as needed, Flexeril 10 mg every night, Augmentin  PEG tube was pulled August 25 2016, he can get up with help take few steps now  EEG on July 27 2016 showed moderate slowing, frequent independent left and right hemisphere spike and sharp waves  MRI of the brain on August 12 2016, mild degree of generalized atrophy, mild small vessel disease   REVIEW OF SYSTEMS: Full 14 system review of systems performed and notable only for weight loss, fatigue  ALLERGIES: Allergies  Allergen Reactions  . Fluconazole Rash and Hives    HOME MEDICATIONS: Current Outpatient Prescriptions  Medication Sig Dispense Refill  . acetaminophen (TYLENOL) 325 MG tablet Take 650 mg by mouth every 4 (four) hours as needed for mild pain or fever.     Marland Kitchen ammonium lactate (LAC-HYDRIN) 12 % lotion Apply 1 application topically 2 (two) times daily. Apply to hands and feet for dry skin for 167 doses    . amoxicillin-clavulanate (AUGMENTIN) 875-125 MG tablet Take 1 tablet by mouth 2 (two) times daily.  For 10 days    . bisacodyl (DULCOLAX) 10 MG suppository Place 10 mg rectally every other day.    . carvedilol (COREG) 12.5 MG tablet Take 12.5 mg by mouth 2 (two) times daily with a meal.     . cyclobenzaprine (FLEXERIL) 10 MG tablet Take 10 mg by mouth at bedtime.    . donepezil (ARICEPT) 10 MG tablet Take 1 tablet (10 mg total) by mouth at bedtime. 90 tablet 4  . HYDROcodone-acetaminophen (NORCO/VICODIN) 5-325 MG tablet Take 1 tablet by mouth every 6 (six) hours as needed for moderate pain. For 10 days    . lacosamide (VIMPAT) 200 MG TABS tablet Take 1 tablet (200 mg total) by mouth 2 (two) times daily. 60 tablet 1  . levETIRAcetam (KEPPRA) 750 MG tablet Take 1,500 mg by mouth 2 (two) times daily. Per tube     . lisinopril (PRINIVIL,ZESTRIL) 5 MG tablet Place 2.5 mg into feeding tube daily.     . mesalamine (LIALDA) 1.2 g EC tablet Take 2.4 g by mouth daily with breakfast.     . mupirocin cream (BACTROBAN) 2 % Apply 1 application topically 2 (two) times daily. Apply to PEG site    . pantoprazole (PROTONIX) 40 MG tablet 40 mg daily.     . sertraline (ZOLOFT) 100 MG tablet Take 150 mg by mouth daily. Take 1-1/2 tablets to = 150 mg    .  thiamine 100 MG tablet Take 100 mg by mouth daily.     . Valproate Sodium (VALPROIC ACID) 250 MG/5ML SOLN Take by mouth. BID     No current facility-administered medications for this visit.     PAST MEDICAL HISTORY: Past Medical History:  Diagnosis Date  . Alcohol use   . 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  Marland Kitchen Hyperlipidemia   . Hypertension   . Seizures ([place])   . Ulcerative colitis ([place])     PAST SURGICAL HISTORY: Past Surgical History:  Procedure Laterality Date  . FINGER SURGERY     partial amputation of right middle finger  . IR GASTROSTOMY TUBE REMOVAL  08/24/2016  . testical surgery     removal  . VASECTOMY      FAMILY HISTORY: Family History  Problem Relation Age of Onset  . Adopted: Yes    SOCIAL  HISTORY:  Social History   Social History  . Marital status: Married    Spouse name: N/A  . Number of children: 4  . Years of education: some college   Occupational History  . Disabled    Social History Main Topics  . Smoking status: Never Smoker  . Smokeless tobacco: Never Used  . Alcohol use No     Comment: Stopped use on 01/03/16  . Drug use: No  . Sexual activity: Not on file   Other Topics Concern  . Not on file   Social History Narrative   Lives in [place] in [place].   Right-handed.   No caffeine use.     PHYSICAL EXAM   Vitals:   09/01/16 0944  BP: 99/67  Pulse: 75  Height: 5' 10"  (1.778 m)    Not recorded      There is no height or weight on file to calculate BMI.  PHYSICAL EXAMNIATION:  Gen: NAD, conversant, well nourised, obese, well groomed                     Cardiovascular: Regular rate rhythm, no peripheral edema, warm, nontender. Eyes: Conjunctivae clear without exudates or hemorrhage Neck: Supple, no carotid bruits. Pulmonary: Clear to auscultation bilaterally   NEUROLOGICAL EXAM:  MENTAL STATUS: Speech:     Slow deliberate talking, follow commands Cognition:     Orientation to time, place and person     Normal recent and remote memory     Normal Attention span and concentration     Normal Language, naming, repeating,spontaneous speech     Fund of knowledge   CRANIAL NERVES: CN II: Visual fields are full to confrontation. Fundoscopic exam is normal with sharp discs and no vascular changes. Pupils are round equal and briskly reactive to light. CN III, IV, VI: extraocular movement are normal. No ptosis. CN V: Facial sensation is intact to pinprick in all 3 divisions bilaterally. Corneal responses are intact.  CN VII: Face is symmetric with normal eye closure and smile. CN VIII: Hearing is normal to rubbing fingers CN IX, X: Palate elevates symmetrically. Phonation is normal. CN XI: Head turning and shoulder shrug are  intact CN XII: Tongue is midline with normal movements and no atrophy.  MOTOR: Motor moderate spasticity of bilateral upper lower extremity, slow rigid antigravity movement,  REFLEXES: Reflexes are 2+ and symmetric at the biceps, triceps, knees, and ankles. Plantar responses are flexor.  SENSORY: Intact to light touch, pinprick, positional sensation and vibratory sensation are intact in fingers and toes.  COORDINATION: Rapid alternating movements and fine finger movements are intact. There is no dysmetria on finger-to-nose and heel-knee-shin.    GAIT/STANCE: Posture is normal. Gait is steady with normal steps, base, arm swing, and turning. Heel and toe walking are normal. Tandem gait is normal.  Romberg is absent.   DIAGNOSTIC DATA (LABS, IMAGING, TESTING) - I reviewed patient records, labs, notes, testing and imaging myself where available.   ASSESSMENT AND PLAN  Alexander Henderson is a 56 y.o. 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 Ph.D.  [facility] [address] [place], [place] [address] Ph: [phone] Fax: [phone]  CC: Blanchie Serve, MD

## 2016-10-12 ENCOUNTER — Emergency Department (HOSPITAL_COMMUNITY)
Admission: EM | Admit: 2016-10-12 | Discharge: 2016-10-12 | Disposition: A | Discharge status: Home / Self Care | Payer: 59 | Attending: Emergency Medicine | Admitting: Emergency Medicine

## 2016-10-12 DIAGNOSIS — Z79899 Other long term (current) drug therapy: Secondary | ICD-10-CM | POA: Diagnosis not present

## 2016-10-12 DIAGNOSIS — R55 Syncope and collapse: Secondary | ICD-10-CM | POA: Diagnosis present

## 2016-10-12 DIAGNOSIS — I959 Hypotension, unspecified: Secondary | ICD-10-CM | POA: Insufficient documentation

## 2016-10-12 DIAGNOSIS — I10 Essential (primary) hypertension: Secondary | ICD-10-CM | POA: Diagnosis not present

## 2016-10-12 LAB — CBC WITH DIFFERENTIAL/PLATELET
Basophils Absolute: 0 10*3/uL (ref 0.0–0.1)
Basophils Relative: 0 %
Eosinophils Absolute: 0.3 10*3/uL (ref 0.0–0.7)
Eosinophils Relative: 4 %
HEMATOCRIT: 41.1 % (ref 39.0–52.0)
HEMOGLOBIN: 14.1 g/dL (ref 13.0–17.0)
LYMPHS ABS: 3 10*3/uL (ref 0.7–4.0)
LYMPHS PCT: 39 %
MCH: 30.4 pg (ref 26.0–34.0)
MCHC: 34.3 g/dL (ref 30.0–36.0)
MCV: 88.6 fL (ref 78.0–100.0)
Monocytes Absolute: 0.7 10*3/uL (ref 0.1–1.0)
Monocytes Relative: 9 %
NEUTROS ABS: 3.8 10*3/uL (ref 1.7–7.7)
NEUTROS PCT: 48 %
Platelets: 287 10*3/uL (ref 150–400)
RBC: 4.64 MIL/uL (ref 4.22–5.81)
RDW: 13 % (ref 11.5–15.5)
WBC: 7.9 10*3/uL (ref 4.0–10.5)

## 2016-10-12 LAB — BASIC METABOLIC PANEL
Anion gap: 9 (ref 5–15)
BUN: 15 mg/dL (ref 6–20)
CHLORIDE: 101 mmol/L (ref 101–111)
CO2: 29 mmol/L (ref 22–32)
Calcium: 8.9 mg/dL (ref 8.9–10.3)
Creatinine, Ser: 0.68 mg/dL (ref 0.61–1.24)
GFR calc Af Amer: >60 mL/min (ref >60)
GFR calc non Af Amer: >60 mL/min (ref >60)
GLUCOSE: 77 mg/dL (ref 65–99)
POTASSIUM: 4.1 mmol/L (ref 3.5–5.1)
SODIUM: 139 mmol/L (ref 135–145)

## 2016-10-12 MED ORDER — SODIUM CHLORIDE 0.9 % IV BOLUS (SEPSIS)
1000.0000 mL | Freq: Once | INTRAVENOUS | Status: AC
  Administered 2016-10-12: 1000 mL via INTRAVENOUS

## 2016-10-12 MED ORDER — CARBIDOPA-LEVODOPA ER 25-100 MG PO TBCR
1.0000 | EXTENDED_RELEASE_TABLET | Freq: Once | ORAL | Status: AC
  Administered 2016-10-12: 1 via ORAL
  Filled 2016-10-12: qty 1

## 2016-10-12 MED ORDER — LEVETIRACETAM 500 MG PO TABS
1500.0000 mg | ORAL_TABLET | Freq: Once | ORAL | Status: AC
  Administered 2016-10-12: 1500 mg via ORAL
  Filled 2016-10-12: qty 3

## 2016-10-12 MED ORDER — CARVEDILOL 6.25 MG PO TABS: 6.2500 mg | ORAL_TABLET | Freq: Two times a day (BID) | ORAL | 0 refills | Status: DC

## 2016-10-12 MED ORDER — LACOSAMIDE 50 MG PO TABS
200.0000 mg | ORAL_TABLET | Freq: Once | ORAL | Status: AC
  Administered 2016-10-12: 200 mg via ORAL
  Filled 2016-10-12: qty 4

## 2016-10-12 NOTE — ED Provider Notes (Addendum)
[place] DEPT Provider Note   CSN: [id number] Arrival date & time: 10/12/16  1501     History   Chief Complaint Chief Complaint  Patient presents with  . Fall  . Head Injury    HPI Alexander Henderson is a 56 y.o. male. Chief complaint is fall.  HPI:  56 year old male. History of seizure complicated with cardiac arrest and anoxic brain injury. This happened last December while attending the [place] football game. Spent multiple months in ICU. Underwent induced coma. Has been recovering at skilled nursing. Is improving. He ambulates with physical therapy.  At times he will get up unassisted. He has been doing so more recently. He fell today. He has a contusion to his forehead. His mental status and interaction per his wife is at baseline. She was concerned because his blood pressure is low.  Wife states that she has addressed her concerns about his blood pressure with the staff. She states that he is on the same medications that he entered the hospital on. However, he has lost 40 pounds as he was fed exclusively through PEG tube for a great deal of time. His PEG tube is out. Is taking by mouth.  Past Medical History:  Diagnosis Date  . Alcohol use   . Anoxic encephalopathy ([place])    secondary to cardiac arrest on 01/03/16.  Marland Kitchen Hyperlipidemia   . Hypertension   . Seizures ([place])   . Ulcerative colitis [facility])     Patient Active Problem List   Diagnosis Date Noted  . Gait abnormality 09/01/2016  . Seizures ([place]) 09/01/2016  . Anoxic brain damage ([place]) 09/01/2016  . Anoxic encephalopathy ([place]) 04/07/2016  . Dysphagia 04/07/2016  . Essential hypertension 04/07/2016  . Seizure disorder ([place]) 04/07/2016  . Gastroesophageal reflux disease 04/07/2016  . Depression 04/07/2016  . History of alcohol use 04/07/2016  . Ulcerative colitis with complication ([place]) 78/93/8101  . Hyperlipidemia 04/07/2016    Past Surgical History:  Procedure Laterality Date  . FINGER SURGERY     partial amputation of right middle finger  . IR GASTROSTOMY TUBE REMOVAL  08/24/2016  . testical surgery     removal  . VASECTOMY         Home Medications    Prior to Admission medications   Medication Sig Start Date End Date Taking? Authorizing Provider  acetaminophen (TYLENOL) 325 MG tablet Take 650 mg by mouth every 4 (four) hours as needed for mild pain or fever.    Yes [provider]  ammonium lactate (LAC-HYDRIN) 12 % lotion Apply 1 application topically 2 (two) times daily. Apply to hands and feet for dry skin for 167 doses   Yes [provider]  bisacodyl (DULCOLAX) 10 MG suppository Place 10 mg rectally every other day.   Yes [provider]  carbidopa-levodopa (SINEMET IR) 25-100 MG tablet Take 1 tablet by mouth 3 (three) times daily. 09/01/16  Yes Marcial Pacas, MD  cyclobenzaprine (FLEXERIL) 10 MG tablet Take 10 mg by mouth at bedtime.   Yes [provider]  donepezil (ARICEPT) 10 MG tablet Take 1 tablet (10 mg total) by mouth at bedtime. 06/29/16  Yes Marcial Pacas, MD  lacosamide (VIMPAT) 200 MG TABS tablet Take 1 tablet (200 mg total) by mouth 2 (two) times daily. 06/09/16  Yes Charlesetta Shanks, MD  levETIRAcetam (KEPPRA) 750 MG tablet Take 1,500 mg by mouth 2 (two) times daily. Per tube    Yes [provider]  mesalamine (LIALDA) 1.2 g EC tablet Take  2.4 g by mouth daily with breakfast.    Yes [provider]  pantoprazole (PROTONIX) 40 MG tablet Take 40 mg by mouth daily.    Yes [provider]  sertraline (ZOLOFT) 100 MG tablet Take 150 mg by mouth daily. Take 1-1/2 tablets to = 150 mg   Yes [provider]  thiamine 100 MG tablet Take 100 mg by mouth daily.    Yes [provider]  Valproate Sodium (VALPROIC ACID) 250 MG/5ML SOLN Take 5 mLs by mouth 2 (two) times daily. BID    Yes [provider]  carvedilol (COREG) 6.25 MG tablet Take 1 tablet (6.25 mg total) by mouth 2 (two) times daily with a  meal. 10/12/16   Tanna Furry, MD    Family History Family History  Problem Relation Age of Onset  . Adopted: Yes    Social History Social History  Substance Use Topics  . Smoking status: Never Smoker  . Smokeless tobacco: Never Used  . Alcohol use No     Comment: Stopped use on 01/03/16     Allergies   Fluconazole   Review of Systems Review of Systems  Constitutional: Negative for appetite change, chills, diaphoresis, fatigue and fever.  HENT: Negative for mouth sores, sore throat and trouble swallowing.        Forehead contusion  Eyes: Negative for visual disturbance.  Respiratory: Negative for cough, chest tightness, shortness of breath and wheezing.   Cardiovascular: Negative for chest pain.  Gastrointestinal: Negative for abdominal distention, abdominal pain, diarrhea, nausea and vomiting.  Endocrine: Negative for polydipsia, polyphagia and polyuria.  Genitourinary: Negative for dysuria, frequency and hematuria.  Musculoskeletal: Negative for gait problem.  Skin: Negative for color change, pallor and rash.  Neurological: Negative for dizziness, syncope, light-headedness and headaches.  Hematological: Does not bruise/bleed easily.  Psychiatric/Behavioral: Negative for behavioral problems and confusion.     Physical Exam Updated Vital Signs BP 96/71 (BP Location: Right Arm)   Pulse 68   Temp 98.9 F (37.2 C) (Oral)   Resp 16   SpO2 98%   Physical Exam  Constitutional: He is oriented to person, place, and time. He appears well-developed and well-nourished. No distress.  HENT:  Head: Normocephalic.  Contusion above eyebrow. No blood over the TMs, mastoids, or from ears nose or mouth. Eyes with normally struck her movements and reactive pupils. No midline neck or back pain.  Eyes: Pupils are equal, round, and reactive to light. Conjunctivae are normal. No scleral icterus.  Neck: Normal range of motion. Neck supple. No thyromegaly present.  Cardiovascular: Normal  rate and regular rhythm.  Exam reveals no gallop and no friction rub.   No murmur heard. Pulmonary/Chest: Effort normal and breath sounds normal. No respiratory distress. He has no wheezes. He has no rales.  Abdominal: Soft. Bowel sounds are normal. He exhibits no distension. There is no tenderness. There is no rebound.  Musculoskeletal: Normal range of motion.  Neurological: He is alert and oriented to person, place, and time.  Skin: Skin is warm and dry. No rash noted.  Psychiatric: He has a normal mood and affect. His behavior is normal.     ED Treatments / Results  Labs (all labs ordered are listed, but only abnormal results are displayed) Labs Reviewed  CBC WITH DIFFERENTIAL/PLATELET  BASIC METABOLIC PANEL    EKG  EKG Interpretation None       Radiology No results found.  Procedures Procedures (including critical care time)  Medications Ordered in  ED Medications  sodium chloride 0.9 % bolus 1,000 mL (not administered)  levETIRAcetam (KEPPRA) tablet 1,500 mg (not administered)  lacosamide (VIMPAT) tablet 200 mg (not administered)  Carbidopa-Levodopa ER (SINEMET CR) 25-100 MG tablet controlled release 1 tablet (not administered)  sodium chloride 0.9 % bolus 1,000 mL (0 mLs Intravenous Stopped 10/12/16 1911)     Initial Impression / Assessment and Plan / ED Course  I have reviewed the triage vital signs and the nursing notes.  Pertinent labs & imaging results that were available during my care of the patient were reviewed by me and considered in my medical decision making (see chart for details).   EKG without changes. Reassuring labs. Given IV fluids. Pressures improved. We'll discontinue his minimal dose of lisinopril. We'll also decrease his Coreg from 25 twice a day to 12.5 twice a day as his heart rate is at 49-50. He is normotensive after IV fluids. Orthostatics are negative. Plan is back to care facility. I recommended decreasing dosages as above.  No  indication for CNS imaging. No indications per [facility] criteria for C-spine imaging  Final Clinical Impressions(s) / ED Diagnoses   Final diagnoses:  Hypotension, unspecified hypotension type    New Prescriptions New Prescriptions   CARVEDILOL (COREG) 6.25 MG TABLET    Take 1 tablet (6.25 mg total) by mouth 2 (two) times daily with a meal.     Tanna Furry, MD 10/15/16 1510    Tanna Furry, MD 10/15/16 2004

## 2016-10-12 NOTE — Discharge Instructions (Signed)
Your blood pressure was low today. This is likely secondary to your blood pressure medications. Often, with weight loss, BP medication dosages need to be adjusted. Stop Lisinopril. New dosage of coreg 6.17m po bid. No coreg until 24 hours of SBP over 120/.

## 2016-10-12 NOTE — ED Triage Notes (Signed)
Per EMS, Pt, from [place], presents for evaluation after multiple trip and falls.  Pain score 2/10.  Hematoma noted on on forehead.  Pt has a gait issue.  Pt in rehab after having a procedure.

## 2016-10-12 NOTE — ED Notes (Signed)
[facility] called for transport.  

## 2016-10-12 NOTE — ED Notes (Signed)
Attempted x 2 to start fIV. Unsuccessful.

## 2016-10-12 NOTE — ED Notes (Signed)
Bed: [facility] Expected date:  Expected time:  Means of arrival:  Comments: EMS- elderly, fall/no complaints

## 2017-03-07 ENCOUNTER — Ambulatory Visit (INDEPENDENT_AMBULATORY_CARE_PROVIDER_SITE_OTHER): Payer: Medicare (Managed Care) | Admitting: Neurology

## 2017-03-07 ENCOUNTER — Encounter: Payer: Self-pay | Admitting: Neurology

## 2017-03-07 VITALS — BP 105/70 | HR 67

## 2017-03-07 DIAGNOSIS — G931 Anoxic brain damage, not elsewhere classified: Secondary | ICD-10-CM

## 2017-03-07 DIAGNOSIS — G40909 Epilepsy, unspecified, not intractable, without status epilepticus: Secondary | ICD-10-CM

## 2017-03-07 NOTE — Progress Notes (Signed)
PATIENT: Alexander Henderson DOB: [birth year]  Chief Complaint  Patient presents with  . Follow-up    Patient has been released from [facility] and is now with the [facility] and doing much better. [name].      HISTORICAL  Alexander Henderson is a 57 years old right-handed male, accompanied by his wife, seen in refer by his primary care physician Dr. Bubba Camp, [place] to follow-up for anoxic brain injury, initial evaluation was on June 30 2016.  He had one generalized seizure in 2007, on January 02 2017, while he was at a football game with his wife, he did have excessive alcohol intake, then while he was sitting with his wife, he utterred " I am so sorry", then staring into space, slumped over, he hold his breath, turned blue, lost control of his bowel and bladder, EMS was called, he had prolonged resuscitation, CPR defibrillator at emergency room, this was treated at [place], he was put on hypothermia for 24-48 hours, while trying to rewarm him up, he was noted to have seizure activity on the EEG, was put back into burst suppression for 2 weeks, was treated with prolonged course of worse it, with 4 antiepileptic medications, MRI of the brain showed no significant abnormality, later under close supervision of continued EEG monitoring, his seizure medicine was gradually tapered to Vimpat 200 mg twice a day, Keppra 750 mg 2 tablets twice a day upon discharge to her rehabilitation center, he require prolonged rehabilitation, had PEG tube and tracheostomy, now he can eat by mouth, walk with assistance, talks slowly, but intelligently, some memory loss complains of fatigue,  But Vimpat was discontinued shortly afterwards, there was also documentation that he refuse his seizure medications sometimes, he suffered a recurrent seizure on Jun 08 2016.  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 Would continue any PO texture as toelrated without restrictions.   I was able to review the record from [facility] admission date January 03 2016, discharge February 08 2016  CT head without contrast January 06 2016, ventricle and sulcal prominent, most compatible with cerebral volume loss, which is somewhat advanced for age, endotracheal and nasogastric tubes are partially visualized, fluid in the nasal cavity and nasopharynx, presumably related to intubation.  MRI of the brain without contrast on January 15 2016, no intracranial hemorrhage, no acute infarction no mass, scattered T2/FLAIR signal hyperintensity in the periventricular and subcortical white matter, nonspecific, compatible with mild microangiopathic changes, bilateral mastoid cell opacification, diffuse paranasal sinus mucosal thickening, complete opacification of the left sphenoid sinus,  CT head without contrast January 20 2016, opacification of the left sphenoid sinus, which has progressed compared to prior areas CT, no acute intracranial abnormalities  Video EEG January 14 2016 evidence of moderate diffuse/multifocal cerebral dysfunction which is in part due to pharmacological sedation, no  significant change  Video EEG January 15 2016, moderate diffuse background slowing, frequent central sharp waves, video EEG January 16 2016 moderate diffuse background slowing, frequent central sharp waves,  January 21 2016, moderate diffuse slowing, occasionally central sharp waves,  January 21 2016, moderate diffuse background slowing, occasionally central sharp waves,  January 30 2016, diffuse slowing, theater with intermixed with delta, fragment of irregular 7-8 Hz posterior dominant activity  He has seizure on June 28th 2018 at 845pm,  was witnessed by [employer], lasting for one mintue, he had no recall of the event,  wife attributed to the recurrent seizure to stopping of the Aricept  Per his wife Keppra, vimpat levels were therapeutic,   Reviewed his medication list, Depakote 250 mg twice a day, Aricept 10 mg daily, Vimpat 200 mg twice a day, Zoloft 100 mg 1 and half tablets daily, protonic 40 mg daily, Keppra 750 mg 2 tablets twice a day, thiamine 100 mg daily, Tylenol as needed, Dulcolax 10 mg suppository as needed, Flexeril 10 mg every night, Augmentin  PEG tube was pulled August 25 2016, he can get up with help take few steps now  EEG on July 27 2016 showed moderate slowing, frequent independent left and right hemisphere spike and sharp waves  MRI of the brain on August 12 2016, mild degree of generalized atrophy, mild small vessel disease  UPDATE Mar 07 2017: He is accompanied by his wife at today's clinical visit, enrolled into [facility] since January 24, 2017, he attends day program Monday through Friday, has nursing assistant every evening, Sinemet 25/100 mg 3 times a day has helped with movement, he can walk with parallel bar, continue have significant gait abnormality, speech difficulty,  He is taking Vimpat 200 mg twice a day, Keppra 750 mg 2 tablets twice a day, and Depakote DR 250 mg twice a day for seizure control, during transition, when he missed couple days of  Depakote, he had recurrent seizure on February 13, 2017.  He has mild to evening time agitations.  REVIEW OF SYSTEMS: Full 14 system review of systems performed and notable only for weight loss, fatigue  ALLERGIES: Allergies  Allergen Reactions  . Fluconazole Rash and Hives    HOME MEDICATIONS: Current Outpatient Medications  Medication Sig Dispense Refill  . acetaminophen (TYLENOL) 325 MG tablet Take 650 mg by mouth every 4 (four) hours as needed for mild pain or fever.     . carbidopa-levodopa (SINEMET IR) 25-100 MG tablet Take 1 tablet by mouth 3 (three) times daily. 90 tablet 6  . cyclobenzaprine (FLEXERIL) 10 MG tablet Take 5 mg by mouth at bedtime.     . donepezil (ARICEPT) 10 MG tablet Take 1 tablet (10 mg total) by mouth at bedtime. 90 tablet 4  . lacosamide (VIMPAT) 200 MG TABS tablet Take 1 tablet (200 mg total) by mouth 2 (two) times daily. 60 tablet 1  . levETIRAcetam (KEPPRA) 750 MG tablet Take 1,500 mg by mouth 2 (two) times daily. Per tube     . mesalamine (LIALDA) 1.2 g EC tablet Take 2.4 g by mouth daily with breakfast.     . pantoprazole (PROTONIX) 40 MG tablet Take 40 mg by mouth daily.     . sertraline (ZOLOFT) 100 MG tablet Take 150 mg by mouth daily. Take 1-1/2 tablets to = 150 mg    .  Valproic Acid 250 MG CPDR Take 250 mg by mouth 2 (two) times daily.     No current facility-administered medications for this visit.     PAST MEDICAL HISTORY: Past Medical History:  Diagnosis Date  . Alcohol use   . Anoxic encephalopathy ([place])    secondary to cardiac arrest on 01/03/16.  Marland Kitchen Hyperlipidemia   . Hypertension   . Seizures ([place])   . Ulcerative colitis ([place])     PAST SURGICAL HISTORY: Past Surgical History:  Procedure Laterality Date  . FINGER SURGERY     partial amputation of right middle finger  . IR GASTROSTOMY TUBE REMOVAL  08/24/2016  . testical surgery     removal  . VASECTOMY      FAMILY HISTORY: Family History  Adopted: Yes    SOCIAL  HISTORY:  Social History   Socioeconomic History  . Marital status: Married    Spouse name: Not on file  . Number of children: 4  . Years of education: some college  . Highest education level: Not on file  Social Needs  . Financial resource strain: Not on file  . Food insecurity - worry: Not on file  . Food insecurity - inability: Not on file  . Transportation needs - medical: Not on file  . Transportation needs - non-medical: Not on file  Occupational History  . Occupation: Disabled  Tobacco Use  . Smoking status: Never Smoker  . Smokeless tobacco: Never Used  Substance and Sexual Activity  . Alcohol use: No    Comment: Stopped use on 01/03/16  . Drug use: No  . Sexual activity: Not on file  Other Topics Concern  . Not on file  Social History Narrative   Lives in [place] in [place].   Right-handed.   No caffeine use.     PHYSICAL EXAM   Vitals:   03/07/17 1313  BP: 105/70  Pulse: 67    Not recorded      There is no height or weight on file to calculate BMI.  PHYSICAL EXAMNIATION:  Gen: NAD, conversant, well nourised, obese, well groomed                     Cardiovascular: Regular rate rhythm, no peripheral edema, warm, nontender. Eyes: Conjunctivae clear without exudates or hemorrhage Neck: Supple, no carotid bruits. Pulmonary: Clear to auscultation bilaterally   NEUROLOGICAL EXAM:  MENTAL STATUS: Speech:     Slow deliberate talking, follow commands Cognition:     Orientation to time, place and person     Normal recent and remote memory     Normal Attention span and concentration     Normal Language, naming, repeating,spontaneous speech     Fund of knowledge   CRANIAL NERVES: CN II: Visual fields are full to confrontation. Fundoscopic exam is normal with sharp discs and no vascular changes. Pupils are round equal and briskly reactive to light. CN III, IV, VI: extraocular movement are normal. No ptosis. CN V: Facial sensation is intact to  pinprick in all 3 divisions bilaterally. Corneal responses are intact.  CN VII: Face is symmetric with normal eye closure and smile. CN VIII: Hearing is normal to rubbing fingers CN IX, X: Palate elevates symmetrically. Phonation is normal. CN XI: Head turning and shoulder shrug are intact CN XII: Tongue is midline with normal movements and no atrophy.  MOTOR: Motor moderate spasticity of bilateral upper lower extremity, slow rigid antigravity movement,  REFLEXES: Reflexes are 2+ and symmetric  at the biceps, triceps, knees, and ankles. Plantar responses are flexor.  SENSORY: Intact to light touch, pinprick, positional sensation and vibratory sensation are intact in fingers and toes.  COORDINATION: Rapid alternating movements and fine finger movements are intact. There is no dysmetria on finger-to-nose and heel-knee-shin.    GAIT/STANCE: Posture is normal. Gait is steady with normal steps, base, arm swing, and turning. Heel and toe walking are normal. Tandem gait is normal.  Romberg is absent.   DIAGNOSTIC DATA (LABS, IMAGING, TESTING) - I reviewed patient records, labs, notes, testing and imaging myself where available.   ASSESSMENT AND PLAN  Alexander Henderson is a 57 y.o. 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  Return to clinic in 6 months with NP    YMarcial Pacas M.D. Ph.D.  [facility] [address] [place] [address]Ph: [phone]: [phone] CC: PBlanchie Serve MD

## 2017-03-09 ENCOUNTER — Ambulatory Visit (INDEPENDENT_AMBULATORY_CARE_PROVIDER_SITE_OTHER): Payer: Medicare (Managed Care) | Admitting: Gastroenterology

## 2017-03-09 ENCOUNTER — Encounter: Payer: Self-pay | Admitting: Gastroenterology

## 2017-03-09 VITALS — BP 104/74 | HR 68 | Wt 175.1 lb

## 2017-03-09 DIAGNOSIS — K5190 Ulcerative colitis, unspecified, without complications: Secondary | ICD-10-CM | POA: Diagnosis not present

## 2017-03-09 NOTE — Progress Notes (Addendum)
[facility] Consult Note:  History: Alexander Henderson 03/09/2017  Referring physician: Dorian Pod, MD ([facility])  Reason for consult/chief complaint: Ulcerative Colitis (to establish care)   Subjective  HPI:  This is a 57 year old man referred to [place] to establish GI care.  He was hospitalized in December 2017 for a cardiac arrest, from which he suffered anoxic brain injury, prolonged hospital stay in rehab with tracheostomy and PEG placement.  He has had a seizure disorder since then managed with medications, followed by [facility] neurology. His wife Alexander Henderson gives the history today.  She reports that her husband was diagnosed with ulcerative colitis in 2009  when he presented with a few months of bloody diarrhea.  He was treated by Dr. Lyda Kalata outside of [facility], and initially received prednisone mesalamine and mercaptopurine.  We do not have those records today, but Beth recalls that it was left-sided colitis.  He had some flares afterwards infrequently, reportedly treated with Entocort or prednisone.  It sounds like he was under good control relatively soon after the diagnosis, and was maintained just on Apriso without mercaptopurine.  In the fall 2016 while living in [place] he had a flare requiring hospitalization and either a sigmoidoscopy or colonoscopy revealing active disease.  He was then put on Lialda 2 tablets once daily and has been in remission since then.  He denies abdominal pain, his wife says he has a rare loose stool and no rectal bleeding.  His appetite is good and his weight stable.  He has no painful eye redness, joint pain or rash.   ROS:  Review of Systems He has a history of seizure disorder and a tremor since his anoxic brain injury.  It is difficult to get additional review of systems since he has a restricted affect, but he denies chest pain or dyspnea and remainder of systems are negative as near as can be  determined.  Past Medical History: Past Medical History:  Diagnosis Date  . Alcohol use   . Anoxic encephalopathy ([place])    secondary to cardiac arrest on 01/03/16.  Marland Kitchen Anxiety   . Depression, major   . Diverticulosis   . GERD (gastroesophageal reflux disease)   . Hyperlipidemia   . Hypertension   . Mobility impaired   . Parkinsonism ([place])   . 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 Recurrent falls   . Seizures ([place])   . Ulcerative colitis [facility])      Past Surgical History: Past Surgical History:  Procedure Laterality Date  . 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 INGUINAL HERNIA REPAIR Right   . IR GASTROSTOMY TUBE REMOVAL  08/24/2016  . testical surgery     removal  . VASECTOMY       Family History: Family History  Adopted: Yes    Social History: Social History   Socioeconomic History  . Marital status: Married    Spouse name: None  . Number of children: 4  . Years of education: some college  . Highest education level: None  Social Needs  . Financial resource strain: None  . Food insecurity - worry: None  . Food insecurity - inability: None  . Transportation needs - medical: None  . Transportation needs - non-medical: None  Occupational History  . Occupation: Disabled  Tobacco Use  . Smoking status: Never Smoker  . Smokeless tobacco: Never Used  Substance and Sexual Activity  . Alcohol use: No    Comment: Stopped use on 01/03/16  . Drug use: No  . Sexual activity: None  Other Topics Concern  . None  Social History Narrative   Lives in [place] in [place].   Right-handed.   No caffeine use.    Allergies: Allergies  Allergen Reactions  . Fluconazole Rash and Hives    Outpatient Meds: Current Outpatient Medications  Medication Sig Dispense Refill  . acetaminophen (TYLENOL) 325 MG tablet Take 650 mg by mouth every 4 (four) hours as needed for mild pain or  fever.     . carbidopa-levodopa (SINEMET IR) 25-100 MG tablet Take 1 tablet by mouth 3 (three) times daily. 90 tablet 6  . cyclobenzaprine (FLEXERIL) 5 MG tablet Take 5 mg by mouth at bedtime.    . donepezil (ARICEPT) 10 MG tablet Take 1 tablet (10 mg total) by mouth at bedtime. 90 tablet 4  . lacosamide (VIMPAT) 200 MG TABS tablet Take 1 tablet (200 mg total) by mouth 2 (two) times daily. 60 tablet 1  . levETIRAcetam (KEPPRA) 750 MG tablet Take 1,500 mg by mouth 2 (two) times daily. Per tube     . mesalamine (LIALDA) 1.2 g EC tablet Take 2.4 g by mouth daily with breakfast.     . pantoprazole (PROTONIX) 40 MG tablet Take 40 mg by mouth daily.     . sertraline (ZOLOFT) 100 MG tablet Take 150 mg by mouth daily. Take 1-1/2 tablets to = 150 mg    . Valproic Acid 250 MG CPDR Take 250 mg by mouth 2 (two) times daily.     No current facility-administered medications for this visit.       ___________________________________________________________________ Objective   Exam:  BP 104/74 (BP Location: Left Arm, Patient Position: Sitting, Cuff Size: Normal)   Pulse 68   Wt 175 lb 2 oz (79.4 kg)   BMI 25.13 kg/m    General: this is a(n) middle-aged man in a wheelchair, cannot get on the exam table, limiting exam.  He has a flat affect, however is alert and follows commands, albeit slowly.  Eyes: sclera anicteric, no redness  ENT: oral mucosa moist without lesions, no cervical or supraclavicular lymphadenopathy, good dentition  CV: RRR without murmur, S1/S2, no JVD, no peripheral edema  Resp: clear to auscultation bilaterally, normal RR and effort noted  GI: soft, no tenderness, with active bowel sounds. No guarding or palpable organomegaly noted.  Skin; warm and dry, no rash or jaundice noted  Neuro: awake, alert and oriented x 3. Normal gross motor function and fluent speech  Labs:  CBC Latest Ref Rng & Units 10/12/2016 06/08/2016 04/27/2016  WBC 4.0 - 10.5 K/uL 7.9 12.3(H) 7.4    Hemoglobin 13.0 - 17.0 g/dL 14.1 13.9 14.0  Hematocrit 39.0 - 52.0 % 41.1 40.1 40(A)  Platelets 150 - 400 K/uL 287 273 298   CMP Latest Ref Rng & Units 10/12/2016 06/08/2016 04/27/2016  Glucose 65 - 99 mg/dL 77 121(H) -  BUN 6 - 20 mg/dL 15 11 11   Creatinine 0.61 - 1.24 mg/dL 0.68 0.71 0.7  Sodium 135 - 145 mmol/L 139 137 142  Potassium 3.5 - 5.1 mmol/L 4.1 3.6 3.7  Chloride 101 - 111 mmol/L 101 103 -  CO2 22 - 32 mmol/L 29 25 -  Calcium 8.9 - 10.3 mg/dL 8.9 8.9 -  Total Protein 6.5 - 8.1 g/dL - 6.2(L) -  Total Bilirubin 0.3 - 1.2 mg/dL -  0.6 -  Alkaline Phos 38 - 126 U/L - 84 -  AST 15 - 41 U/L - 17 -  ALT 17 - 63 U/L - 19 -     Assessment: Encounter Diagnosis  Name Primary?  . Ulcerative colitis without complications, unspecified location ([place]) Yes    His condition appears to be under very good control for at least the last 2 years on low-dose mesalamine.  Naturally, we will continue that dose and this is [facility]. He should have a creatinine checked annually because of the rare chance of a nephritis that can be caused by mesalamine. It is also unclear why he is still on a PPI.  His wife believes it was started sometime during his critical illness and just never stopped.  He does not get heartburn or regurgitation by either of their reports.  I think it can be quickly weaned off.  I recommend decreased to every other day for a week, then every third day for a week and stop.  He should also be on at least 1000 mg a day of calcium plus vitamin D because of his underlying IBD and increased risk of osteoporosis.  He should also receive a flu shot annually, which is already most likely being done through the [facility].  We have had his wife sign a release form so we can try to get some records from his original GI practice near [place] and also the most recent practice in [place], mainly so we can see colonoscopy reports and know  the extent of the his colitis.  Then I can determine at what point he is in need of every 1-2-year colonoscopy for colon cancer screening.  His wife is aware that he has had an increased risk.  I told her that once he has 15 years of left-sided colitis or 8 years of pancolitis, he will need regular colonoscopy every 1-2 years.  He will return to see me in 6 months or sooner as needed.  Thank you for the courtesy of this consult.  Please call me with any questions or concerns.  Nelida Meuse III  CC: Dorian Pod, MD

## 2017-03-09 NOTE — Patient Instructions (Signed)
If you are age 56 or older, your body mass index should be between 23-30. Your Body mass index is 25.13 kg/m. If this is out of the aforementioned range listed, please consider follow up with your Primary Care Provider.  If you are age 56 or younger, your body mass index should be between 19-25. Your Body mass index is 25.13 kg/m. If this is out of the aformentioned range listed, please consider follow up with your Primary Care Provider.   Thank you for choosing [facility]  Dr Wilfrid Lund III

## 2017-03-10 ENCOUNTER — Telehealth: Payer: Self-pay | Admitting: Neurology

## 2017-03-10 NOTE — Telephone Encounter (Signed)
I was able to discuss with [facility] Jimmye Norman,  1.Wife did reported that patient has improved bilateral upper extremities dexterity  taking carbidopa levodopa, it is okay to change to extended release 25/100 mg twice a day to better accommodate his schedule.  2.  Keep current dose of Keppra 750 mg 2 tablets twice a day, Vimpat 200 mg twice a day for seizure control,  3.  He has some agitations, Dr. Gwyndolyn Saxon is considering switch his Zoloft to Celexa,  4 .  Current dose of Depakote DR 250 mg twice a day, may consider switch Keppra to lamotrigine later for better mood control.

## 2017-03-10 NOTE — Telephone Encounter (Signed)
Dr [facility] (p) [phone] called to discuss the pt's medications. She has some ideas on medication adjustments. If she doesn't answer please LVM of a good contact number she call back on. Thank you

## 2017-03-11 ENCOUNTER — Telehealth: Payer: Self-pay | Admitting: Gastroenterology

## 2017-03-11 NOTE — Telephone Encounter (Signed)
[facility] wants clarification on how much calcium and vitamin D patient is to have daily, also do you want calcium carbonate.? Thank you.

## 2017-03-11 NOTE — Telephone Encounter (Signed)
Any over the counter 500 mg  calcium carbonate or calcium citrate tablet that includes vitamin D.  Take 2 tablets once daily.  That is a fairly routine primary care question that they can feel free to address further with Dr. Jimmye Norman at [facility] if needed.

## 2017-03-14 NOTE — Telephone Encounter (Signed)
Left detailed message for Rise Paganini, NP about clarification on calcium and vitamin D.

## 2017-11-17 ENCOUNTER — Emergency Department (HOSPITAL_COMMUNITY)
Admission: EM | Admit: 2017-11-17 | Discharge: 2017-11-17 | Disposition: A | Discharge status: Home / Self Care | Payer: Medicare (Managed Care) | Attending: Emergency Medicine | Admitting: Emergency Medicine

## 2017-11-17 ENCOUNTER — Emergency Department (HOSPITAL_COMMUNITY): Payer: Medicare (Managed Care)

## 2017-11-17 ENCOUNTER — Encounter (HOSPITAL_COMMUNITY): Payer: Self-pay | Admitting: Internal Medicine

## 2017-11-17 DIAGNOSIS — W1830XA Fall on same level, unspecified, initial encounter: Secondary | ICD-10-CM | POA: Insufficient documentation

## 2017-11-17 DIAGNOSIS — Z79899 Other long term (current) drug therapy: Secondary | ICD-10-CM | POA: Diagnosis not present

## 2017-11-17 DIAGNOSIS — G20 Parkinson's disease: Secondary | ICD-10-CM | POA: Diagnosis not present

## 2017-11-17 DIAGNOSIS — Y92121 Bathroom in nursing home as the place of occurrence of the external cause: Secondary | ICD-10-CM | POA: Diagnosis not present

## 2017-11-17 DIAGNOSIS — M542 Cervicalgia: Secondary | ICD-10-CM | POA: Insufficient documentation

## 2017-11-17 DIAGNOSIS — I10 Essential (primary) hypertension: Secondary | ICD-10-CM | POA: Diagnosis not present

## 2017-11-17 DIAGNOSIS — Y999 Unspecified external cause status: Secondary | ICD-10-CM | POA: Insufficient documentation

## 2017-11-17 DIAGNOSIS — Y9389 Activity, other specified: Secondary | ICD-10-CM | POA: Insufficient documentation

## 2017-11-17 DIAGNOSIS — R569 Unspecified convulsions: Secondary | ICD-10-CM | POA: Diagnosis not present

## 2017-11-17 DIAGNOSIS — W19XXXA Unspecified fall, initial encounter: Secondary | ICD-10-CM

## 2017-11-17 DIAGNOSIS — M25511 Pain in right shoulder: Secondary | ICD-10-CM | POA: Insufficient documentation

## 2017-11-17 LAB — CBC WITH DIFFERENTIAL/PLATELET
Abs Immature Granulocytes: 0.01 10*3/uL (ref 0.00–0.07)
BASOS PCT: 0 %
Basophils Absolute: 0 10*3/uL (ref 0.0–0.1)
EOS ABS: 0.1 10*3/uL (ref 0.0–0.5)
Eosinophils Relative: 2 %
HCT: 38.8 % — ABNORMAL LOW (ref 39.0–52.0)
Hemoglobin: 12.8 g/dL — ABNORMAL LOW (ref 13.0–17.0)
IMMATURE GRANULOCYTES: 0 %
LYMPHS PCT: 30 %
Lymphs Abs: 1.7 10*3/uL (ref 0.7–4.0)
MCH: 30.8 pg (ref 26.0–34.0)
MCHC: 33 g/dL (ref 30.0–36.0)
MCV: 93.5 fL (ref 80.0–100.0)
MONO ABS: 0.4 10*3/uL (ref 0.1–1.0)
MONOS PCT: 7 %
NRBC: 0 % (ref 0.0–0.2)
Neutro Abs: 3.5 10*3/uL (ref 1.7–7.7)
Neutrophils Relative %: 61 %
PLATELETS: 233 10*3/uL (ref 150–400)
RBC: 4.15 MIL/uL — ABNORMAL LOW (ref 4.22–5.81)
RDW: 12.2 % (ref 11.5–15.5)
WBC: 5.7 10*3/uL (ref 4.0–10.5)

## 2017-11-17 LAB — CBG MONITORING, ED
Glucose-Capillary: 80 mg/dL (ref 70–99)
Glucose-Capillary: 113 mg/dL — ABNORMAL HIGH (ref 70–99)

## 2017-11-17 LAB — BASIC METABOLIC PANEL
Anion gap: 8 (ref 5–15)
BUN: 11 mg/dL (ref 6–20)
CHLORIDE: 107 mmol/L (ref 98–111)
CO2: 24 mmol/L (ref 22–32)
Calcium: 8.2 mg/dL — ABNORMAL LOW (ref 8.9–10.3)
Creatinine, Ser: 0.82 mg/dL (ref 0.61–1.24)
GFR calc non Af Amer: >60 mL/min (ref >60)
Glucose, Bld: 79 mg/dL (ref 70–99)
Potassium: 4.7 mmol/L (ref 3.5–5.1)
SODIUM: 139 mmol/L (ref 135–145)

## 2017-11-17 LAB — MAGNESIUM: Magnesium: 2 mg/dL (ref 1.7–2.4)

## 2017-11-17 LAB — VALPROIC ACID LEVEL: Valproic Acid Lvl: <10 ug/mL — ABNORMAL LOW (ref 50.0–100.0)

## 2017-11-17 MED ORDER — ACETAMINOPHEN 500 MG PO TABS
1000.0000 mg | ORAL_TABLET | Freq: Once | ORAL | Status: AC
  Administered 2017-11-17: 1000 mg via ORAL
  Filled 2017-11-17: qty 2

## 2017-11-17 MED ORDER — LORAZEPAM 2 MG/ML IJ SOLN
INTRAMUSCULAR | Status: AC
  Administered 2017-11-17: 1 mg via INTRAVENOUS
  Filled 2017-11-17: qty 1

## 2017-11-17 MED ORDER — LACOSAMIDE 200 MG PO TABS
200.0000 mg | ORAL_TABLET | ORAL | Status: AC
  Administered 2017-11-17: 200 mg via ORAL
  Filled 2017-11-17: qty 1

## 2017-11-17 MED ORDER — VALPROATE SODIUM 500 MG/5ML IV SOLN
1200.0000 mg | INTRAVENOUS | Status: AC
  Administered 2017-11-17: 1200 mg via INTRAVENOUS
  Filled 2017-11-17: qty 12

## 2017-11-17 MED ORDER — LEVETIRACETAM IN NACL 1000 MG/100ML IV SOLN
1000.0000 mg | Freq: Once | INTRAVENOUS | Status: AC
  Administered 2017-11-17: 1000 mg via INTRAVENOUS
  Filled 2017-11-17: qty 100

## 2017-11-17 MED ORDER — ONDANSETRON HCL 4 MG/2ML IJ SOLN
4.0000 mg | Freq: Once | INTRAMUSCULAR | Status: AC
  Administered 2017-11-17: 4 mg via INTRAVENOUS
  Filled 2017-11-17: qty 2

## 2017-11-17 MED ORDER — LORAZEPAM 2 MG/ML IJ SOLN
1.0000 mg | Freq: Once | INTRAMUSCULAR | Status: AC
  Administered 2017-11-17: 1 mg via INTRAVENOUS

## 2017-11-17 NOTE — ED Notes (Signed)
[facility] called for transport.  

## 2017-11-17 NOTE — ED Notes (Signed)
ED Provider at bedside. 

## 2017-11-17 NOTE — ED Notes (Signed)
Patient transported to X-ray 

## 2017-11-17 NOTE — ED Provider Notes (Signed)
[place] [facility] EMERGENCY DEPARTMENT Provider Note   CSN: [id number] Arrival date & time: 11/17/17  1213     History   Chief Complaint Chief Complaint  Patient presents with  . Fall    HPI Alexander Henderson is a 57 y.o. male with a history of an anoxic brain injury and seizures who presents the emergency department with chief complaint of unwitnessed fall.  He has a history of seizures.  His wife is at the bedside and states that he has been very well controlled on his medications for some time.  She states that sometimes he forgets that he is unable to walk or he gets impatient waiting to go to the bathroom and tries to go himself.  He has difficulty with ambulation and predominantly stays in a wheelchair.  Patient was found on his belly in the bathroom at his nursing facility.  He is currently there because they are having a medication change for behavioral issues.  The patient is unable put to provide other history.  Therefore there is a level 5 caveat.  According to EMS he complained of pain in his right shoulder and neck.  HPI  Past Medical History:  Diagnosis Date  . Alcohol use   . Anoxic encephalopathy ([place])    secondary to cardiac arrest on 01/03/16.  Marland Kitchen Anxiety   . Depression, major   . Diverticulosis   . GERD (gastroesophageal reflux disease)   . Hyperlipidemia   . Hypertension   . Mobility impaired   . Parkinsonism ([place])   . 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 Recurrent falls   . Seizures ([place])   . Ulcerative colitis [facility])     Patient Active Problem List   Diagnosis Date Noted  . Gait abnormality 09/01/2016  . Seizures ([place]) 09/01/2016  . Anoxic brain damage ([place]) 09/01/2016  . Anoxic encephalopathy ([place]) 04/07/2016  . Dysphagia 04/07/2016  . Essential hypertension 04/07/2016  . Seizure disorder ([place]) 04/07/2016  . Gastroesophageal reflux disease 04/07/2016  . Depression 04/07/2016  . History of  alcohol use 04/07/2016  . Ulcerative colitis with complication ([place]) [id number]/[id number]  . Hyperlipidemia 04/07/2016    Past Surgical History:  Procedure Laterality Date  . 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 INGUINAL HERNIA REPAIR Right   . IR GASTROSTOMY TUBE REMOVAL  08/24/2016  . testical surgery     removal  . VASECTOMY          Home Medications    Prior to Admission medications   Medication Sig Start Date End Date Taking? Authorizing Provider  acetaminophen (TYLENOL) 325 MG tablet Take 650 mg by mouth every 4 (four) hours as needed for mild pain or fever.     [provider]  carbidopa-levodopa (SINEMET IR) 25-100 MG tablet Take 1 tablet by mouth 3 (three) times daily. 09/01/16   Marcial Pacas, MD  cyclobenzaprine (FLEXERIL) 5 MG tablet Take 5 mg by mouth at bedtime.    [provider]  donepezil (ARICEPT) 10 MG tablet Take 1 tablet (10 mg total) by mouth at bedtime. 06/29/16   Marcial Pacas, MD  lacosamide (VIMPAT) 200 MG TABS tablet Take 1 tablet (200 mg total) by mouth 2 (two) times daily. 06/09/16   Charlesetta Shanks, MD  levETIRAcetam (KEPPRA) 750 MG tablet Take 1,500 mg by mouth 2 (two) times daily. Per tube     [provider]  mesalamine (  LIALDA) 1.2 g EC tablet Take 2.4 g by mouth daily with breakfast.     [provider]  pantoprazole (PROTONIX) 40 MG tablet Take 40 mg by mouth daily.     [provider]  sertraline (ZOLOFT) 100 MG tablet Take 150 mg by mouth daily. Take 1-1/2 tablets to = 150 mg    [provider]  Valproic Acid 250 MG CPDR Take 250 mg by mouth 2 (two) times daily.    [provider]    Family History Family History  Adopted: Yes    Social History Social History   Tobacco Use  . Smoking status: Never Smoker  . Smokeless tobacco: Never Used  Substance Use Topics  . Alcohol use: No    Comment: Stopped use on 01/03/16  . Drug use: No     Allergies     Fluconazole   Review of Systems Review of Systems  Unable to perform ROS: Other  History of anoxic brain injury and unreliable historian  Physical Exam Updated Vital Signs BP 131/84   Pulse 93   Temp (!) 97.1 F (36.2 C) (Oral)   Resp 18   SpO2 100%   Physical Exam  Constitutional: He appears well-developed and well-nourished. No distress.  HENT:  Head: Normocephalic and atraumatic.  Eyes: Pupils are equal, round, and reactive to light. Conjunctivae and EOM are normal. No scleral icterus.  Neck:  C-collar in place  Cardiovascular: Normal rate, regular rhythm and normal heart sounds.  Pulmonary/Chest: Effort normal and breath sounds normal. No respiratory distress.  Abdominal: Soft. There is no tenderness.  Musculoskeletal: He exhibits no edema.  Will range of motion of bilateral shoulders, no deformities bruises or tenderness over the arms or lower extremities.  Normal pulses and sensation, normal grip strength.  Neurological: He is alert.  Skin: Skin is warm and dry. He is not diaphoretic.  Psychiatric: His behavior is normal.  Nursing note and vitals reviewed.    ED Treatments / Results  Labs (all labs ordered are listed, but only abnormal results are displayed) Labs Reviewed  BASIC METABOLIC PANEL - Abnormal; Notable for the following components:      Result Value   Calcium 8.2 (*)    All other components within normal limits  CBC WITH DIFFERENTIAL/PLATELET - Abnormal; Notable for the following components:   RBC 4.15 (*)    Hemoglobin 12.8 (*)    HCT 38.8 (*)    All other components within normal limits  VALPROIC ACID LEVEL - Abnormal; Notable for the following components:   Valproic Acid Lvl <10 (*)    All other components within normal limits  MAGNESIUM  CBG MONITORING, ED  CBG MONITORING, ED    EKG None  Radiology Dg Shoulder Right  Result Date: 11/17/2017 CLINICAL DATA:  Right shoulder pain after unwitnessed fall. EXAM: RIGHT SHOULDER - 2+ VIEW  COMPARISON:  None. FINDINGS: There is no evidence of fracture or dislocation. There is no evidence of arthropathy or other focal bone abnormality. Soft tissues are unremarkable. IMPRESSION: Negative. Electronically Signed   By: Marijo Conception, M.D.   On: 11/17/2017 13:39   Ct Head Wo Contrast  Result Date: 11/17/2017 CLINICAL DATA:  57 year old male with head and neck injury. EXAM: CT HEAD WITHOUT CONTRAST CT CERVICAL SPINE WITHOUT CONTRAST TECHNIQUE: Multidetector CT imaging of the head and cervical spine was performed following the standard protocol without intravenous contrast. Multiplanar CT image reconstructions of the cervical spine were also generated. COMPARISON:  08/24/2016  CT and 08/12/2016 MR FINDINGS: CT HEAD FINDINGS Brain: No evidence of acute infarction, hemorrhage, hydrocephalus, extra-axial collection or mass lesion/mass effect. Mild atrophy and mild chronic small-vessel white matter ischemic changes noted. Vascular: No hyperdense vessel or unexpected calcification. Skull: Normal. Negative for fracture or focal lesion. Sinuses/Orbits: No acute finding. Other: None. CT CERVICAL SPINE FINDINGS Alignment: Normal. Skull base and vertebrae: No acute fracture. No primary bone lesion or focal pathologic process. Soft tissues and spinal canal: No prevertebral fluid or swelling. No visible canal hematoma. Disc levels: Moderate degenerative disc disease/spondylosis and broad-based disc bulges at C5-6 and C6-7. Upper chest: No acute abnormality Other: None IMPRESSION: 1. No evidence of acute intracranial abnormality. Mild atrophy and mild chronic small-vessel white matter ischemic changes. 2. No static evidence of acute injury to the cervical spine. Moderate degenerative disc disease/spondylosis and broad-based disc bulges at C5-6 and C6-7. Electronically Signed   By: Margarette Canada M.D.   On: 11/17/2017 13:35   Ct Cervical Spine Wo Contrast  Result Date: 11/17/2017 CLINICAL DATA:  57 year old male  with head and neck injury. EXAM: CT HEAD WITHOUT CONTRAST CT CERVICAL SPINE WITHOUT CONTRAST TECHNIQUE: Multidetector CT imaging of the head and cervical spine was performed following the standard protocol without intravenous contrast. Multiplanar CT image reconstructions of the cervical spine were also generated. COMPARISON:  08/24/2016 CT and 08/12/2016 MR FINDINGS: CT HEAD FINDINGS Brain: No evidence of acute infarction, hemorrhage, hydrocephalus, extra-axial collection or mass lesion/mass effect. Mild atrophy and mild chronic small-vessel white matter ischemic changes noted. Vascular: No hyperdense vessel or unexpected calcification. Skull: Normal. Negative for fracture or focal lesion. Sinuses/Orbits: No acute finding. Other: None. CT CERVICAL SPINE FINDINGS Alignment: Normal. Skull base and vertebrae: No acute fracture. No primary bone lesion or focal pathologic process. Soft tissues and spinal canal: No prevertebral fluid or swelling. No visible canal hematoma. Disc levels: Moderate degenerative disc disease/spondylosis and broad-based disc bulges at C5-6 and C6-7. Upper chest: No acute abnormality Other: None IMPRESSION: 1. No evidence of acute intracranial abnormality. Mild atrophy and mild chronic small-vessel white matter ischemic changes. 2. No static evidence of acute injury to the cervical spine. Moderate degenerative disc disease/spondylosis and broad-based disc bulges at C5-6 and C6-7. Electronically Signed   By: Margarette Canada M.D.   On: 11/17/2017 13:35    Procedures Procedures (including critical care time)  Medications Ordered in ED Medications  levETIRAcetam (KEPPRA) IVPB 1000 mg/100 mL premix (has no administration in time range)  LORazepam (ATIVAN) injection 1 mg (1 mg Intravenous Given 11/17/17 1647)     Initial Impression / Assessment and Plan / ED Course  I have reviewed the triage vital signs and the nursing notes.  Pertinent labs & imaging results that were available during  my care of the patient were reviewed by me and considered in my medical decision making (see chart for details).     With unwitnessed fall.  His seizures have been well controlled per his wife and he does not appear to have any tachycardia.  He has no loss of continence or injuries to his tongue.  I have low suspicion that seizure was a cause of his unwitnessed fall.  His wife feel strongly that he likely just fell trying to get to the toilet.  Patient's imaging reviewed and shows no acute abnormalities.  Labs reviewed and he appears to have a low Depakote level.  His labs are otherwise within normal limits for some old anemia.  He will be discharged to skilled  nursing facility at this time.  Final Clinical Impressions(s) / ED Diagnoses   Final diagnoses:  Fall, initial encounter    ED Discharge Orders    None       Margarita Mail, PA-C 11/17/17 1724    Gareth Morgan, MD 11/18/17 2235

## 2017-11-17 NOTE — ED Provider Notes (Signed)
Pt was waiting for d/c when he went to the bathroom.  While he was sitting on the commode, he had a seizure lasting about 25 seconds.  The pt has a hx of seizure d/c and went to the SNF ([facility] yesterday for medication management.  Unfortunately, the pt's meds did not get delivered to the SNF and he has not had any seizure meds since yesterday morning at his home.  Depakote level <10.  Pt given 1 mg ativan IV.  He is now awake and at his baseline.  Depakote IV dose given.  Keppra IV dose given.  Home vimpat dose given.  Pt vomited the vimpat, so he was given zofran.  He was watched for several hours.  He is now back to his baseline.  The pt's wife will bring the meds to the SNF tomorrow morning.  Pt's wife notified [facility].   Isla Pence, MD 11/17/17 2145

## 2017-11-17 NOTE — ED Notes (Signed)
D/C reviewed with patient and spouse. [facility] requested for transport

## 2017-11-17 NOTE — ED Triage Notes (Signed)
Pt here for evaluation after falling this morning at [facility] facility. He is wheelchair bound and A/Ox1 at baseline. Found in prone position after unwitnessed fall. Pt alert/concious for EMS. C/o right shoulder pain upon EMS arrival. Placed in C-collar. Hx epilepsy and anoxic brain injury. Pt is new to [employer] [facility] [employer]. Per staff at facility, patient is at baseline mentally.

## 2017-11-17 NOTE — ED Notes (Signed)
Pt had a seizure while sitting on toilet, tonic clonic in nature , lasting approx 25 sec pt placed on stretcher moved to room , placed on NRB ativan given

## 2017-11-17 NOTE — Discharge Instructions (Addendum)
Return for any new or worsening concerns,

## 2017-11-17 NOTE — ED Notes (Signed)
Attempted report to [facility]. Unsuccessful.

## 2018-03-08 ENCOUNTER — Ambulatory Visit: Payer: Medicare (Managed Care) | Admitting: Nurse Practitioner

## 2018-03-09 ENCOUNTER — Ambulatory Visit: Payer: Medicare (Managed Care) | Admitting: Neurology

## 2018-03-09 ENCOUNTER — Telehealth: Payer: Self-pay | Admitting: *Deleted

## 2018-03-09 NOTE — Telephone Encounter (Signed)
No showed follow up appointment. 

## 2018-03-13 ENCOUNTER — Encounter: Payer: Self-pay | Admitting: Neurology

## 2018-05-08 ENCOUNTER — Other Ambulatory Visit: Payer: Self-pay | Admitting: Internal Medicine

## 2019-02-05 ENCOUNTER — Telehealth: Payer: Self-pay | Admitting: Internal Medicine

## 2019-02-05 NOTE — Telephone Encounter (Signed)
Cassandra, nurse at [facility] again called [facility]--resident is [facility].  Had fall at 7:05pm on Sat evening.  No injury.  They'd put a door alarm on but pt didn't open his door, she told me.  VS stable.

## 2019-02-24 IMAGING — CR DG CHEST 1V PORT
1 series · 1 of 1 positions shown · non-contrast
Comparison: None.

CLINICAL DATA: Seizure tonight.

EXAM:
PORTABLE CHEST 1 VIEW

[AP]
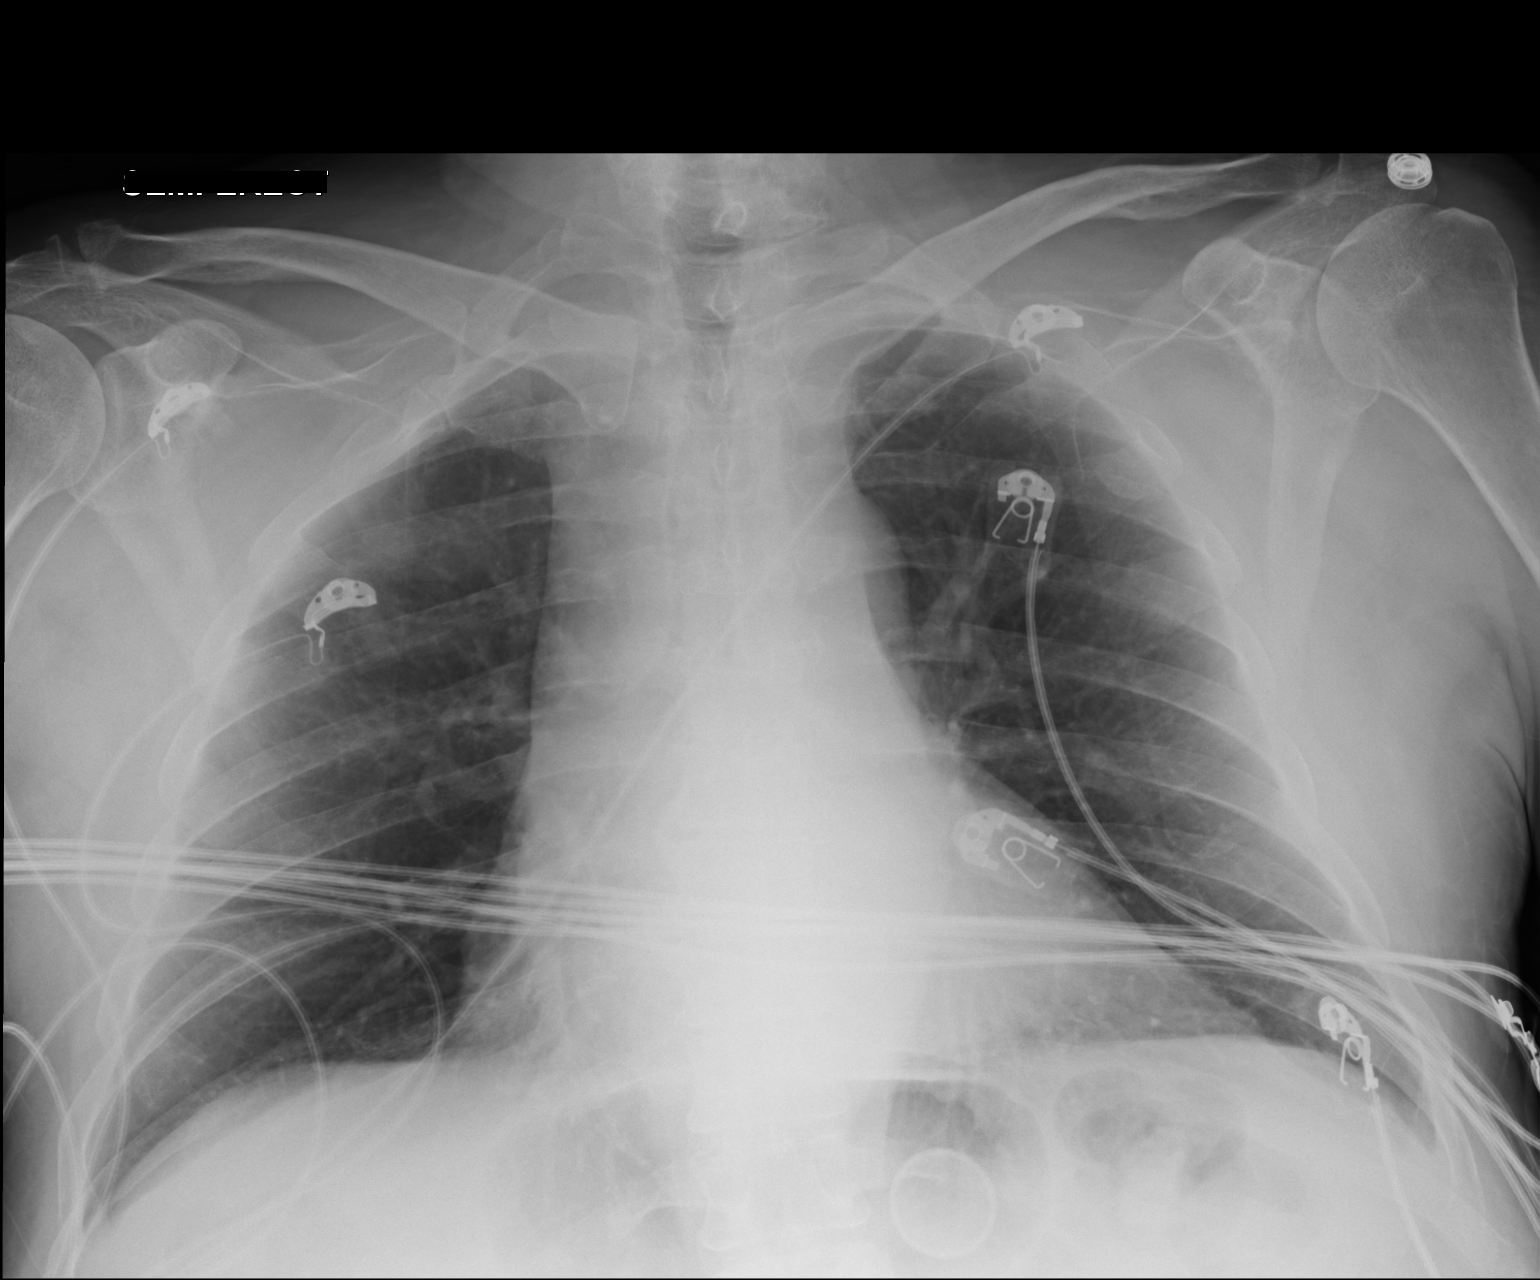

[1 of 1 positions shown; findings below may reference images not displayed]

FINDINGS: Cardiomediastinal silhouette is unremarkable for this low
inspiratory examination with crowded vasculature markings. The lungs
are clear without pleural effusions or focal consolidations. Trachea
projects midline and there is no pneumothorax. Included soft tissue
planes and osseous structures are non-suspicious. Round plastic
appearing foreign body projecting in stomach could represent
gastrostomy tube or, ingested foreign body.
IMPRESSION: No acute cardiopulmonary process.

## 2019-04-12 ENCOUNTER — Telehealth: Payer: Self-pay | Admitting: Gastroenterology

## 2019-04-12 NOTE — Telephone Encounter (Addendum)
-----   Message from Alexander Henderson sent at 04/05/2019  3:30 PM EST ----- Regarding: Message from Dr. Jimmye Norman Dr. Loletha Carrow, Dr. Dorian Pod From [facility] inquired whether this pt requires GI follow up.  She stated that pt is doing well on mesalamine and is currently in a nursing home.   ______________________________________________________  I spoke with Dr. Jimmye Norman on this patient.  Alexander Henderson remains on the same dose of mesalamine, has had no chronic diarrhea, rectal bleeding or other indications of colitis flare.  Unfortunately, his functional status from anoxic brain injury has steadily declined since I last saw him, he is now living full-time in a nursing facility, and his life expectancy is felt perhaps to be about 5 years.  As such, my advice was that a colonoscopy for colon cancer surveillance and disease activity assessment sounds like greater risk than benefit.  Dr. Jimmye Norman felt the same, and believed that Alexander Henderson wife would feel the same based on their previous discussions.  Asked her to make sure that a chemistry panel was done at least annually because mesalamine can rarely cause a nephritis.  She said he gets a full panel of blood work every 6 months.  Dr. Jimmye Norman will contact me if this patient has having any flares or other problems.

## 2019-11-29 ENCOUNTER — Encounter (HOSPITAL_COMMUNITY): Payer: Self-pay

## 2019-11-29 ENCOUNTER — Encounter (HOSPITAL_COMMUNITY): Payer: Self-pay | Admitting: Emergency Medicine

## 2019-11-29 ENCOUNTER — Emergency Department (HOSPITAL_COMMUNITY): Payer: Medicare (Managed Care)

## 2019-11-29 ENCOUNTER — Observation Stay (HOSPITAL_COMMUNITY)
Admission: EM | Admit: 2019-11-29 | Discharge: 2019-11-30 | Disposition: A | Discharge status: Home / Self Care | Payer: Medicare (Managed Care) | Attending: Emergency Medicine | Admitting: Emergency Medicine

## 2019-11-29 ENCOUNTER — Other Ambulatory Visit: Payer: Self-pay

## 2019-11-29 ENCOUNTER — Emergency Department (HOSPITAL_COMMUNITY)
Admission: EM | Admit: 2019-11-29 | Discharge: 2019-11-29 | Disposition: A | Discharge status: Home / Self Care | Payer: Medicare (Managed Care) | Source: Home / Self Care | Attending: Emergency Medicine | Admitting: Emergency Medicine

## 2019-11-29 DIAGNOSIS — Z79899 Other long term (current) drug therapy: Secondary | ICD-10-CM | POA: Insufficient documentation

## 2019-11-29 DIAGNOSIS — R569 Unspecified convulsions: Secondary | ICD-10-CM | POA: Insufficient documentation

## 2019-11-29 DIAGNOSIS — G40909 Epilepsy, unspecified, not intractable, without status epilepticus: Secondary | ICD-10-CM | POA: Diagnosis not present

## 2019-11-29 DIAGNOSIS — G931 Anoxic brain damage, not elsewhere classified: Secondary | ICD-10-CM | POA: Diagnosis present

## 2019-11-29 DIAGNOSIS — Z20822 Contact with and (suspected) exposure to covid-19: Secondary | ICD-10-CM | POA: Insufficient documentation

## 2019-11-29 DIAGNOSIS — G20 Parkinson's disease: Secondary | ICD-10-CM | POA: Insufficient documentation

## 2019-11-29 DIAGNOSIS — I10 Essential (primary) hypertension: Secondary | ICD-10-CM | POA: Insufficient documentation

## 2019-11-29 DIAGNOSIS — K5190 Ulcerative colitis, unspecified, without complications: Secondary | ICD-10-CM | POA: Diagnosis present

## 2019-11-29 LAB — I-STAT CHEM 8, ED
BUN: 14 mg/dL (ref 6–20)
BUN: 13 mg/dL (ref 6–20)
Calcium, Ion: 1.18 mmol/L (ref 1.15–1.40)
Calcium, Ion: 1.04 mmol/L — ABNORMAL LOW (ref 1.15–1.40)
Chloride: 101 mmol/L (ref 98–111)
Chloride: 106 mmol/L (ref 98–111)
Creatinine, Ser: 0.9 mg/dL (ref 0.61–1.24)
Creatinine, Ser: 0.8 mg/dL (ref 0.61–1.24)
Glucose, Bld: 91 mg/dL (ref 70–99)
Glucose, Bld: 96 mg/dL (ref 70–99)
HCT: 43 % (ref 39.0–52.0)
HCT: 41 % (ref 39.0–52.0)
Hemoglobin: 14.6 g/dL (ref 13.0–17.0)
Hemoglobin: 13.9 g/dL (ref 13.0–17.0)
Potassium: 4.1 mmol/L (ref 3.5–5.1)
Potassium: 3.9 mmol/L (ref 3.5–5.1)
Sodium: 142 mmol/L (ref 135–145)
Sodium: 140 mmol/L (ref 135–145)
TCO2: 29 mmol/L (ref 22–32)
TCO2: 25 mmol/L (ref 22–32)

## 2019-11-29 LAB — CBC WITH DIFFERENTIAL/PLATELET
Abs Immature Granulocytes: 0.03 10*3/uL (ref 0.00–0.07)
Abs Immature Granulocytes: 0.03 10*3/uL (ref 0.00–0.07)
Basophils Absolute: 0 10*3/uL (ref 0.0–0.1)
Basophils Absolute: 0 10*3/uL (ref 0.0–0.1)
Basophils Relative: 0 %
Basophils Relative: 0 %
Eosinophils Absolute: 0.3 10*3/uL (ref 0.0–0.5)
Eosinophils Absolute: 0.2 10*3/uL (ref 0.0–0.5)
Eosinophils Relative: 3 %
Eosinophils Relative: 2 %
HCT: 44.8 % (ref 39.0–52.0)
HCT: 44.5 % (ref 39.0–52.0)
Hemoglobin: 15.1 g/dL (ref 13.0–17.0)
Hemoglobin: 15.1 g/dL (ref 13.0–17.0)
Immature Granulocytes: 0 %
Immature Granulocytes: 0 %
Lymphocytes Relative: 25 %
Lymphocytes Relative: 20 %
Lymphs Abs: 2.6 10*3/uL (ref 0.7–4.0)
Lymphs Abs: 1.7 10*3/uL (ref 0.7–4.0)
MCH: 31.5 pg (ref 26.0–34.0)
MCH: 31.8 pg (ref 26.0–34.0)
MCHC: 33.7 g/dL (ref 30.0–36.0)
MCHC: 33.9 g/dL (ref 30.0–36.0)
MCV: 93.5 fL (ref 80.0–100.0)
MCV: 93.7 fL (ref 80.0–100.0)
Monocytes Absolute: 0.7 10*3/uL (ref 0.1–1.0)
Monocytes Absolute: 0.7 10*3/uL (ref 0.1–1.0)
Monocytes Relative: 7 %
Monocytes Relative: 8 %
Neutro Abs: 6.7 10*3/uL (ref 1.7–7.7)
Neutro Abs: 5.9 10*3/uL (ref 1.7–7.7)
Neutrophils Relative %: 65 %
Neutrophils Relative %: 70 %
Platelets: 252 10*3/uL (ref 150–400)
Platelets: 236 10*3/uL (ref 150–400)
RBC: 4.79 MIL/uL (ref 4.22–5.81)
RBC: 4.75 MIL/uL (ref 4.22–5.81)
RDW: 12 % (ref 11.5–15.5)
RDW: 12.2 % (ref 11.5–15.5)
WBC: 10.3 10*3/uL (ref 4.0–10.5)
WBC: 8.7 10*3/uL (ref 4.0–10.5)
nRBC: 0 % (ref 0.0–0.2)
nRBC: 0 % (ref 0.0–0.2)

## 2019-11-29 LAB — COMPREHENSIVE METABOLIC PANEL
ALT: 15 U/L (ref 0–44)
ALT: 14 U/L (ref 0–44)
AST: 18 U/L (ref 15–41)
AST: 16 U/L (ref 15–41)
Albumin: 3.4 g/dL — ABNORMAL LOW (ref 3.5–5.0)
Albumin: 3.2 g/dL — ABNORMAL LOW (ref 3.5–5.0)
Alkaline Phosphatase: 74 U/L (ref 38–126)
Alkaline Phosphatase: 72 U/L (ref 38–126)
Anion gap: 11 (ref 5–15)
Anion gap: 7 (ref 5–15)
BUN: 10 mg/dL (ref 6–20)
BUN: 10 mg/dL (ref 6–20)
CO2: 24 mmol/L (ref 22–32)
CO2: 23 mmol/L (ref 22–32)
Calcium: 8.8 mg/dL — ABNORMAL LOW (ref 8.9–10.3)
Calcium: 8.5 mg/dL — ABNORMAL LOW (ref 8.9–10.3)
Chloride: 103 mmol/L (ref 98–111)
Chloride: 108 mmol/L (ref 98–111)
Creatinine, Ser: 1.02 mg/dL (ref 0.61–1.24)
Creatinine, Ser: 0.78 mg/dL (ref 0.61–1.24)
GFR, Estimated: >60 mL/min (ref >60)
GFR, Estimated: >60 mL/min (ref >60)
Glucose, Bld: 97 mg/dL (ref 70–99)
Glucose, Bld: 101 mg/dL — ABNORMAL HIGH (ref 70–99)
Potassium: 3.5 mmol/L (ref 3.5–5.1)
Potassium: 3.8 mmol/L (ref 3.5–5.1)
Sodium: 138 mmol/L (ref 135–145)
Sodium: 138 mmol/L (ref 135–145)
Total Bilirubin: 0.4 mg/dL (ref 0.3–1.2)
Total Bilirubin: 0.5 mg/dL (ref 0.3–1.2)
Total Protein: 6.2 g/dL — ABNORMAL LOW (ref 6.5–8.1)
Total Protein: 5.8 g/dL — ABNORMAL LOW (ref 6.5–8.1)

## 2019-11-29 LAB — RESPIRATORY PANEL BY RT PCR (FLU A&B, COVID)
Influenza A by PCR: NEGATIVE
Influenza B by PCR: NEGATIVE
SARS Coronavirus 2 by RT PCR: NEGATIVE

## 2019-11-29 LAB — HIV ANTIBODY (ROUTINE TESTING W REFLEX): HIV Screen 4th Generation wRfx: NONREACTIVE

## 2019-11-29 LAB — RAPID URINE DRUG SCREEN, HOSP PERFORMED
Amphetamines: NOT DETECTED
Barbiturates: NOT DETECTED
Benzodiazepines: NOT DETECTED
Cocaine: NOT DETECTED
Opiates: NOT DETECTED
Tetrahydrocannabinol: NOT DETECTED

## 2019-11-29 LAB — CBG MONITORING, ED: Glucose-Capillary: 120 mg/dL — ABNORMAL HIGH (ref 70–99)

## 2019-11-29 LAB — VALPROIC ACID LEVEL: Valproic Acid Lvl: <10 ug/mL — ABNORMAL LOW (ref 50.0–100.0)

## 2019-11-29 LAB — MAGNESIUM: Magnesium: 2.1 mg/dL (ref 1.7–2.4)

## 2019-11-29 LAB — GLUCOSE, CAPILLARY: Glucose-Capillary: 78 mg/dL (ref 70–99)

## 2019-11-29 MED ORDER — LACOSAMIDE 50 MG PO TABS: 200.0000 mg | ORAL_TABLET | Freq: Two times a day (BID) | ORAL | Status: DC

## 2019-11-29 MED ORDER — LEVETIRACETAM IN NACL 1500 MG/100ML IV SOLN: 1500.0000 mg | Freq: Once | INTRAVENOUS | Status: DC

## 2019-11-29 MED ORDER — ACETAMINOPHEN 650 MG RE SUPP: 650.0000 mg | RECTAL | Status: DC | PRN

## 2019-11-29 MED ORDER — SERTRALINE HCL 100 MG PO TABS
100.0000 mg | ORAL_TABLET | Freq: Every day | ORAL | Status: DC
  Administered 2019-11-29 – 2019-11-30 (×2): 100 mg via ORAL
  Filled 2019-11-29 (×2): qty 1

## 2019-11-29 MED ORDER — NYSTATIN 100000 UNIT/GM EX POWD
1.0000 "application " | Freq: Two times a day (BID) | CUTANEOUS | Status: DC
  Administered 2019-11-29 – 2019-11-30 (×2): 1 via TOPICAL
  Filled 2019-11-29: qty 15

## 2019-11-29 MED ORDER — LEVETIRACETAM 750 MG PO TABS
1500.0000 mg | ORAL_TABLET | Freq: Two times a day (BID) | ORAL | Status: DC
  Administered 2019-11-29 – 2019-11-30 (×3): 1500 mg via ORAL
  Filled 2019-11-29: qty 3
  Filled 2019-11-29 (×2): qty 2

## 2019-11-29 MED ORDER — ONDANSETRON HCL 4 MG/2ML IJ SOLN: 4.0000 mg | Freq: Four times a day (QID) | INTRAMUSCULAR | Status: DC | PRN

## 2019-11-29 MED ORDER — LEVETIRACETAM 500 MG PO TABS: 1500.0000 mg | ORAL_TABLET | Freq: Two times a day (BID) | ORAL | Status: DC

## 2019-11-29 MED ORDER — ENOXAPARIN SODIUM 40 MG/0.4ML ~~LOC~~ SOLN
40.0000 mg | SUBCUTANEOUS | Status: DC
  Administered 2019-11-29 – 2019-11-30 (×2): 40 mg via SUBCUTANEOUS
  Filled 2019-11-29 (×2): qty 0.4

## 2019-11-29 MED ORDER — SODIUM CHLORIDE 0.9 % IV BOLUS
500.0000 mL | Freq: Once | INTRAVENOUS | Status: AC
  Administered 2019-11-29: 500 mL via INTRAVENOUS

## 2019-11-29 MED ORDER — BOOST / RESOURCE BREEZE PO LIQD CUSTOM
1.0000 | Freq: Three times a day (TID) | ORAL | Status: DC
  Administered 2019-11-30: 1 via ORAL

## 2019-11-29 MED ORDER — LACOSAMIDE 200 MG PO TABS
200.0000 mg | ORAL_TABLET | Freq: Two times a day (BID) | ORAL | Status: DC
  Administered 2019-11-29 – 2019-11-30 (×3): 200 mg via ORAL
  Filled 2019-11-29: qty 4
  Filled 2019-11-29 (×2): qty 1

## 2019-11-29 MED ORDER — SODIUM CHLORIDE 0.9 % IV BOLUS
1000.0000 mL | Freq: Once | INTRAVENOUS | Status: AC
  Administered 2019-11-29: 1000 mL via INTRAVENOUS

## 2019-11-29 MED ORDER — ONDANSETRON HCL 4 MG PO TABS: 4.0000 mg | ORAL_TABLET | Freq: Four times a day (QID) | ORAL | Status: DC | PRN

## 2019-11-29 MED ORDER — MIRTAZAPINE 15 MG PO TABS
7.5000 mg | ORAL_TABLET | Freq: Every day | ORAL | Status: DC
  Administered 2019-11-29: 7.5 mg via ORAL
  Filled 2019-11-29 (×2): qty 1

## 2019-11-29 MED ORDER — ACETAMINOPHEN 325 MG PO TABS: 650.0000 mg | ORAL_TABLET | ORAL | Status: DC | PRN

## 2019-11-29 MED ORDER — MESALAMINE 1.2 G PO TBEC
2.4000 g | DELAYED_RELEASE_TABLET | Freq: Every day | ORAL | Status: DC
  Administered 2019-11-30: 2.4 g via ORAL
  Filled 2019-11-29 (×2): qty 2

## 2019-11-29 MED ORDER — LEVETIRACETAM IN NACL 1500 MG/100ML IV SOLN
1500.0000 mg | Freq: Once | INTRAVENOUS | Status: AC
  Administered 2019-11-29: 1500 mg via INTRAVENOUS
  Filled 2019-11-29: qty 100

## 2019-11-29 MED ORDER — POLYETHYLENE GLYCOL 3350 17 G PO PACK: 17.0000 g | PACK | Freq: Every day | ORAL | Status: DC | PRN

## 2019-11-29 MED ORDER — ACETAMINOPHEN 325 MG PO TABS
650.0000 mg | ORAL_TABLET | Freq: Once | ORAL | Status: AC
  Administered 2019-11-29: 650 mg via ORAL
  Filled 2019-11-29: qty 2
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
## 2019-11-29 NOTE — ED Notes (Signed)
Staff at facility called to inform of pt discharge and PTAR ordered

## 2019-11-29 NOTE — ED Triage Notes (Signed)
Patient has a hx of epilepsy had a seizure at 0130 lasted for 2 minutes. Baseline does not communicate. CBG 113. Patient was given no medications.

## 2019-11-29 NOTE — ED Triage Notes (Signed)
Pt was just discharged from this hospital for seizures. Pt just arrived at [facility] and was found to be having a grand mal seizure lasting about 3 minutes. Baseline- pt whispers and answers yes and no question. Pt is not always oriented per EMS.

## 2019-11-29 NOTE — ED Provider Notes (Signed)
[facility] EMERGENCY DEPARTMENT Provider Note   CSN: [id number] Arrival date & time: 11/29/19  8916  LEVEL 5 CAVEAT - NONVERBAL History Chief Complaint  Patient presents with  . Seizures    Alexander Henderson is a 59 y.o. male.  HPI 59 year old male presents with seizure.  History is limited as it only comes from EMS at this time.  The patient has a history of anoxic brain injury and so he can talk but does not provide much history.  Patient was seen earlier in the morning/middle the night for seizure.  He was loaded with Keppra.  No reported seizure activity during his ED stay.  Upon discharge he went back to his facility and then had a seizure on his bed lasting about 3 minutes.  It was generalized tonic-clonic.  He was incontinent.  EMS reports otherwise he has been stable.  Patient tells me he hurts all over when asked.  I discussed with his caregiver at [facility], who indicates that he had a seizure when he came back, maybe 1 hour after getting back from the ER.  Otherwise, since arriving at [facility] he has rarely had seizures.  She estimates he has not had one before today in months.  No recent medication changes or acute illness such as fever.   Past Medical History:  Diagnosis Date  . Alcohol use   . 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  Marland Kitchen Anxiety   . Depression, major   . Diverticulosis   . GERD (gastroesophageal reflux disease)   . Hyperlipidemia   . Hypertension   . Mobility impaired   . Parkinsonism ([place])   . 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 Recurrent falls   . Seizures ([place])   . Ulcerative colitis [facility])     Patient Active Problem List   Diagnosis Date Noted  . Seizure ([place]) 11/29/2019  . Gait abnormality 09/01/2016  . Seizures ([place]) 09/01/2016  . Anoxic brain damage ([place]) 09/01/2016  . Anoxic encephalopathy ([place]) 04/07/2016  . Dysphagia 04/07/2016   . Essential hypertension 04/07/2016  . Seizure disorder ([place]) 04/07/2016  . Gastroesophageal reflux disease 04/07/2016  . Depression 04/07/2016  . History of alcohol use 04/07/2016  . Ulcerative colitis with complication ([place]) 94/50/3888  . Hyperlipidemia 04/07/2016    Past Surgical History:  Procedure Laterality Date  . 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 INGUINAL HERNIA REPAIR Right   . IR GASTROSTOMY TUBE REMOVAL  08/24/2016  . testical surgery     removal  . VASECTOMY         Family History  Adopted: Yes    Social History   Tobacco Use  . Smoking status: Never Smoker  . Smokeless tobacco: Never Used  Vaping Use  . Vaping Use: Never used  Substance Use Topics  . Alcohol use: No    Comment: Stopped use on 01/03/16  . Drug use: No    Home Medications Prior to Admission medications   Medication Sig Start Date End Date Taking? Authorizing Provider  acetaminophen (TYLENOL) 500 MG tablet Take 500 mg by mouth every 8 (eight) hours as needed for mild pain or fever.    Yes [provider]  bisacodyl (DULCOLAX) 10 MG suppository Place 10 mg rectally See admin instructions. Give 1 suppository (10 mg totally) rectally 1 dose in 24 hours as needed if not  relief by Milk of Magnesium   Yes [provider]  calcium-vitamin D (OSCAL WITH D) 500-200 MG-UNIT tablet Take 1 tablet by mouth 2 (two) times daily.   Yes [provider]  lacosamide (VIMPAT) 200 MG TABS tablet Take 1 tablet (200 mg total) by mouth 2 (two) times daily. 06/09/16  Yes Charlesetta Shanks, MD  levETIRAcetam (KEPPRA) 750 MG tablet Take 1,500 mg by mouth 2 (two) times daily. Per tube    Yes [provider]  Magnesium Hydroxide (MILK OF MAGNESIA PO) Take 30 mLs by mouth See admin instructions. Give 30 ml Milk of Magnesium by mouth 1 time in 24 hours as needed of no bowel movement in 3 days   Yes [provider]  mesalamine (LIALDA) 1.2 g EC  tablet Take 2.4 g by mouth daily with breakfast.    Yes [provider]  mirtazapine (REMERON) 7.5 MG tablet Take 7.5 mg by mouth at bedtime.   Yes [provider]  nystatin (NYSTATIN) powder Apply 1 application topically 2 (two) times daily. To groin   Yes [provider]  sennosides-docusate sodium (SENOKOT-S) 8.6-50 MG tablet Take 2 tablets by mouth at bedtime as needed for constipation.   Yes [provider]  sertraline (ZOLOFT) 100 MG tablet Take 100 mg by mouth daily.    Yes [provider]  Sodium Phosphates (RA SALINE ENEMA RE) Place 1 each rectally See admin instructions. Give 1 dose rectally disposable Saline Enema in 24 hours as needed if not relief by Bisacodyl suppository   Yes [provider]  zinc oxide (BALMEX) 11.3 % CREA cream Apply 1 application topically See admin instructions. Apply into the bottom and perineal area 6 times a days as needed for redness   Yes [provider]  carbidopa-levodopa (SINEMET IR) 25-100 MG tablet Take 1 tablet by mouth 3 (three) times daily. Patient not taking: Reported on 11/17/2017 09/01/16   Marcial Pacas, MD  donepezil (ARICEPT) 10 MG tablet Take 1 tablet (10 mg total) by mouth at bedtime. Patient not taking: Reported on 11/17/2017 06/29/16   Marcial Pacas, MD    Allergies    Fluconazole  Review of Systems   Review of Systems  Unable to perform ROS: Patient nonverbal    Physical Exam Updated Vital Signs BP 116/82   Pulse 68   Temp 98.6 F (37 C) (Oral)   Resp 14   SpO2 96%   Physical Exam Vitals and nursing note reviewed.  Constitutional:      General: He is not in acute distress.    Appearance: He is well-developed. He is not ill-appearing or diaphoretic.  HENT:     Head: Normocephalic and atraumatic.     Right Ear: External ear normal.     Left Ear: External ear normal.     Nose: Nose normal.  Eyes:     General:        Right eye: No discharge.        Left eye: No  discharge.     Pupils: Pupils are equal, round, and reactive to light.  Cardiovascular:     Rate and Rhythm: Normal rate and regular rhythm.     Heart sounds: Normal heart sounds.  Pulmonary:     Effort: Pulmonary effort is normal.     Breath sounds: Normal breath sounds.  Abdominal:     Palpations: Abdomen is soft.     Tenderness: There is no abdominal tenderness.  Musculoskeletal:     Cervical  back: Normal range of motion and neck supple. No rigidity.  Skin:    General: Skin is warm and dry.  Neurological:     Mental Status: He is alert.     Comments: Equal strength in all 4 extremities.  He is awake and alert.  Psychiatric:        Mood and Affect: Mood is not anxious.     ED Results / Procedures / Treatments   Labs (all labs ordered are listed, but only abnormal results are displayed) Labs Reviewed  COMPREHENSIVE METABOLIC PANEL - Abnormal; Notable for the following components:      Result Value   Calcium 8.8 (*)    Total Protein 6.2 (*)    Albumin 3.4 (*)    All other components within normal limits  CBG MONITORING, ED - Abnormal; Notable for the following components:   Glucose-Capillary 120 (*)    All other components within normal limits  RESPIRATORY PANEL BY RT PCR (FLU A&B, COVID)  CBC WITH DIFFERENTIAL/PLATELET  MAGNESIUM  RAPID URINE DRUG SCREEN, HOSP PERFORMED  URINALYSIS, ROUTINE W REFLEX MICROSCOPIC  LACOSAMIDE  LEVETIRACETAM LEVEL  HIV ANTIBODY (ROUTINE TESTING W REFLEX)  COMPREHENSIVE METABOLIC PANEL    EKG EKG Interpretation  Date/Time:  Thursday November 29 2019 09:15:33 EDT Ventricular Rate:  93 PR Interval:    QRS Duration: 87 QT Interval:  357 QTC Calculation: 444 R Axis:   20 Text Interpretation: Sinus rhythm Borderline T abnormalities, inferior leads Baseline wander in lead(s) V1 similar to Oct 2019 Confirmed by Sherwood Gambler [phone]) on 11/29/2019 9:28:04 AM   Radiology CT HEAD WO CONTRAST  Result Date: 11/29/2019 CLINICAL DATA:   Nontraumatic seizure EXAM: CT HEAD WITHOUT CONTRAST TECHNIQUE: Contiguous axial images were obtained from the base of the skull through the vertex without intravenous contrast. COMPARISON:  CT head 11/17/2017 FINDINGS: Brain: Mild atrophy. Negative for hydrocephalus. Negative for acute infarct, hemorrhage, mass. Vascular: Negative for hyperdense vessel Skull: Negative Sinuses/Orbits: Mucosal edema base of right maxillary sinus. Negative orbit Other: None IMPRESSION: Mild generalized atrophy.  No acute abnormality. Electronically Signed   By: Franchot Gallo M.D.   On: 11/29/2019 12:01    Procedures Procedures (including critical care time)  Medications Ordered in ED Medications  levETIRAcetam (KEPPRA) tablet 1,500 mg (1,500 mg Oral Given 11/29/19 1430)  lacosamide (VIMPAT) tablet 200 mg (200 mg Oral Given 11/29/19 1430)  enoxaparin (LOVENOX) injection 40 mg (40 mg Subcutaneous Given 11/29/19 1554)  acetaminophen (TYLENOL) tablet 650 mg (has no administration in time range)    Or  acetaminophen (TYLENOL) suppository 650 mg (has no administration in time range)  polyethylene glycol (MIRALAX / GLYCOLAX) packet 17 g (has no administration in time range)  ondansetron (ZOFRAN) tablet 4 mg (has no administration in time range)    Or  ondansetron (ZOFRAN) injection 4 mg (has no administration in time range)  mesalamine (LIALDA) EC tablet 2.4 g (has no administration in time range)  mirtazapine (REMERON) tablet 7.5 mg (has no administration in time range)  nystatin (MYCOSTATIN/NYSTOP) topical powder 1 application (has no administration in time range)  sertraline (ZOLOFT) tablet 100 mg (has no administration in time range)  sodium chloride 0.9 % bolus 1,000 mL (0 mLs Intravenous Stopped 11/29/19 1400)  acetaminophen (TYLENOL) tablet 650 mg (650 mg Oral Given 11/29/19 1345)    ED Course  I have reviewed the triage vital signs and the nursing notes.  Pertinent labs & imaging results that were available  during my care of the  patient were reviewed by me and considered in my medical decision making (see chart for details).    MDM Rules/Calculators/A&P                          Patient has had no seizure-like activity here.  Given breakthrough seizures I consulted neurology who recommends better medication reconciliation and observation admission.  They will follow and advise on meds.  Internal medicine teaching service to admit. Final Clinical Impression(s) / ED Diagnoses Final diagnoses:  Seizure [facility])    Rx / DC Orders ED Discharge Orders    None       Sherwood Gambler, MD 11/29/19 1557

## 2019-11-29 NOTE — Plan of Care (Signed)
MOD ASSIST

## 2019-11-29 NOTE — ED Provider Notes (Signed)
[facility] EMERGENCY DEPARTMENT Provider Note   CSN: [id number] Arrival date & time: 11/29/19  0225     History Chief Complaint  Patient presents with  . Seizures    Alexander Henderson is a 59 y.o. male.  The history is provided by the EMS personnel. The history is limited by the condition of the patient.  Seizures Seizure activity on arrival: no   Seizure type:  Grand mal Initial focality:  None Episode characteristics: generalized shaking   Postictal symptoms: confusion   Return to baseline: yes   Severity:  Mild Duration:  2 minutes Timing:  Once Number of seizures this episode:  1 Progression:  Resolved Context: not possible hypoglycemia   Recent head injury:  No recent head injuries PTA treatment:  None History of seizures: no        Past Medical History:  Diagnosis Date  . Alcohol use   . 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  Marland Kitchen Anxiety   . Depression, major   . Diverticulosis   . GERD (gastroesophageal reflux disease)   . Hyperlipidemia   . Hypertension   . Mobility impaired   . Parkinsonism ([place])   . 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 Recurrent falls   . Seizures ([place])   . Ulcerative colitis [facility])     Patient Active Problem List   Diagnosis Date Noted  . Gait abnormality 09/01/2016  . Seizures ([place]) 09/01/2016  . Anoxic brain damage ([place]) 09/01/2016  . Anoxic encephalopathy ([place]) 04/07/2016  . Dysphagia 04/07/2016  . Essential hypertension 04/07/2016  . Seizure disorder ([place]) 04/07/2016  . Gastroesophageal reflux disease 04/07/2016  . Depression 04/07/2016  . History of alcohol use 04/07/2016  . Ulcerative colitis with complication ([place]) 92/42/6834  . Hyperlipidemia 04/07/2016    Past Surgical History:  Procedure Laterality Date  . 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 INGUINAL HERNIA REPAIR Right   .  IR GASTROSTOMY TUBE REMOVAL  08/24/2016  . testical surgery     removal  . VASECTOMY         Family History  Adopted: Yes    Social History   Tobacco Use  . Smoking status: Never Smoker  . Smokeless tobacco: Never Used  Vaping Use  . Vaping Use: Never used  Substance Use Topics  . Alcohol use: No    Comment: Stopped use on 01/03/16  . Drug use: No    Home Medications Prior to Admission medications   Medication Sig Start Date End Date Taking? Authorizing Provider  acetaminophen (TYLENOL) 500 MG tablet Take 500 mg by mouth 3 (three) times daily.    Yes [provider]  calcium-vitamin D (OSCAL WITH D) 500-200 MG-UNIT tablet Take 1 tablet by mouth 2 (two) times daily.   Yes [provider]  lacosamide (VIMPAT) 200 MG TABS tablet Take 1 tablet (200 mg total) by mouth 2 (two) times daily. 06/09/16  Yes Charlesetta Shanks, MD  levETIRAcetam (KEPPRA) 750 MG tablet Take 1,500 mg by mouth 2 (two) times daily. Per tube    Yes [provider]  mesalamine (LIALDA) 1.2 g EC tablet Take 2.4 g by mouth daily with breakfast.    Yes [provider]  mirtazapine (REMERON) 7.5 MG tablet Take 7.5 mg by mouth at bedtime.   Yes [provider]  nystatin (NYSTATIN) powder Apply 1 application  topically 2 (two) times daily. To groin   Yes [provider]  sennosides-docusate sodium (SENOKOT-S) 8.6-50 MG tablet Take 2 tablets by mouth at bedtime as needed for constipation.   Yes [provider]  sertraline (ZOLOFT) 100 MG tablet Take 100 mg by mouth daily.    Yes [provider]  zinc oxide (BALMEX) 11.3 % CREA cream Apply 1 application topically 6 (six) times daily. As needed for redness   Yes [provider]  carbidopa-levodopa (SINEMET IR) 10-100 MG tablet Take 0.5 tablets by mouth 3 (three) times daily. Patient not taking: Reported on 11/29/2019    [provider]  carbidopa-levodopa (SINEMET IR) 10-100 MG tablet  Take 1 tablet by mouth See admin instructions. Take 1 tablet by mouth at midday [facility] give this to patient) Patient not taking: Reported on 11/29/2019    [provider]  carbidopa-levodopa (SINEMET IR) 25-100 MG tablet Take 1 tablet by mouth 3 (three) times daily. Patient not taking: Reported on 11/17/2017 09/01/16   Marcial Pacas, MD  donepezil (ARICEPT) 10 MG tablet Take 1 tablet (10 mg total) by mouth at bedtime. Patient not taking: Reported on 11/17/2017 06/29/16   Marcial Pacas, MD    Allergies    Fluconazole  Review of Systems   Review of Systems  Unable to perform ROS: Other  Neurological: Positive for seizures.    Physical Exam Updated Vital Signs BP (!) 136/91   Pulse 71   Temp 98.4 F (36.9 C) (Oral)   Resp 16   Ht 6' (1.829 m)   Wt 93.9 kg   SpO2 98%   BMI 28.07 kg/m   Physical Exam Vitals and nursing note reviewed.  Constitutional:      Appearance: Normal appearance. He is not diaphoretic.  HENT:     Head: Normocephalic and atraumatic.     Nose: Nose normal.  Eyes:     Conjunctiva/sclera: Conjunctivae normal.     Pupils: Pupils are equal, round, and reactive to light.  Cardiovascular:     Rate and Rhythm: Normal rate and regular rhythm.     Pulses: Normal pulses.     Heart sounds: Normal heart sounds.  Pulmonary:     Effort: Pulmonary effort is normal.     Breath sounds: Normal breath sounds.  Abdominal:     General: Abdomen is flat. Bowel sounds are normal.     Palpations: Abdomen is soft.     Tenderness: There is no abdominal tenderness. There is no guarding or rebound.  Musculoskeletal:        General: Normal range of motion.     Cervical back: Normal range of motion and neck supple.  Skin:    General: Skin is warm and dry.     Capillary Refill: Capillary refill takes less than 2 seconds.  Neurological:     General: No focal deficit present.     Mental Status: He is alert.     Deep Tendon Reflexes: Reflexes normal.  Psychiatric:         Mood and Affect: Mood normal.     ED Results / Procedures / Treatments   Labs (all labs ordered are listed, but only abnormal results are displayed) Labs Reviewed  VALPROIC ACID LEVEL - Abnormal; Notable for the following components:      Result Value   Valproic Acid Lvl <10 (*)    All other components within normal limits  CBC WITH DIFFERENTIAL/PLATELET  I-STAT CHEM 8, ED  EKG None  Radiology No results found.  Procedures Procedures (including critical care time)  Medications Ordered in ED Medications  levETIRAcetam (KEPPRA) IVPB 1500 mg/ 100 mL premix (0 mg Intravenous Stopped 11/29/19 0359)  sodium chloride 0.9 % bolus 500 mL (500 mLs Intravenous New Bag/Given 11/29/19 0329)    ED Course  I have reviewed the triage vital signs and the nursing notes.  Pertinent labs & imaging results that were available during my care of the patient were reviewed by me and considered in my medical decision making (see chart for details).  Patient looks like he was taken off depakote and started on VIMPAT.  Keppra loaded in the ED>  Call your neurologist in the am regarding adjustment to vimpat as it was just started.  PO challenged in the ED.  No further seizures.    Alexander Henderson was evaluated in Emergency Department on 11/29/2019 for the symptoms described in the history of present illness. He was evaluated in the context of the global COVID-19 pandemic, which necessitated consideration that the patient might be at risk for infection with the SARS-CoV-2 virus that causes COVID-19. Institutional protocols and algorithms that pertain to the evaluation of patients at risk for COVID-19 are in a state of rapid change based on information released by regulatory bodies including the CDC and federal and state organizations. These policies and algorithms were followed during the patient's care in the ED.  Final Clinical Impression(s) / ED Diagnoses Return for intractable cough, coughing  up blood,fevers >100.4 unrelieved by medication, shortness of breath, intractable vomiting, chest pain, shortness of breath, weakness,numbness, changes in speech, facial asymmetry,abdominal pain, passing out,Inability to tolerate liquids or food, cough, altered mental status or any concerns. No signs of systemic illness or infection. The patient is nontoxic-appearing on exam and vital signs are within normal limits.   I have reviewed the triage vital signs and the nursing notes. Pertinent labs &imaging results that were available during my care of the patient were reviewed by me and considered in my medical decision making (see chart for details).After history, exam, and medical workup I feel the patient has beenappropriately medically screened and is safe for discharge home. Pertinent diagnoses were discussed with the patient. Patient was given return precautions.    Abelino Tippin, MD 11/29/19 [phone]

## 2019-11-29 NOTE — ED Notes (Signed)
Pt able to drink a glass of water, denies N/V

## 2019-11-30 DIAGNOSIS — R569 Unspecified convulsions: Secondary | ICD-10-CM | POA: Diagnosis not present

## 2019-11-30 LAB — URINALYSIS, ROUTINE W REFLEX MICROSCOPIC
Bacteria, UA: NONE SEEN
Bilirubin Urine: NEGATIVE
Glucose, UA: NEGATIVE mg/dL
Hgb urine dipstick: NEGATIVE
Ketones, ur: NEGATIVE mg/dL
Nitrite: NEGATIVE
Protein, ur: NEGATIVE mg/dL
Specific Gravity, Urine: 1.008 (ref 1.005–1.030)
pH: 5 (ref 5.0–8.0)

## 2019-11-30 LAB — TSH: TSH: 0.79 u[IU]/mL (ref 0.350–4.500)

## 2019-11-30 LAB — CBC
HCT: 41.1 % (ref 39.0–52.0)
Hemoglobin: 14.2 g/dL (ref 13.0–17.0)
MCH: 31.8 pg (ref 26.0–34.0)
MCHC: 34.5 g/dL (ref 30.0–36.0)
MCV: 92.2 fL (ref 80.0–100.0)
Platelets: 219 10*3/uL (ref 150–400)
RBC: 4.46 MIL/uL (ref 4.22–5.81)
RDW: 12.3 % (ref 11.5–15.5)
WBC: 8.2 10*3/uL (ref 4.0–10.5)
nRBC: 0 % (ref 0.0–0.2)

## 2019-11-30 LAB — MRSA PCR SCREENING: MRSA by PCR: NEGATIVE

## 2019-11-30 MED ORDER — LEVETIRACETAM 500 MG PO TABS
500.0000 mg | ORAL_TABLET | Freq: Once | ORAL | Status: AC
  Administered 2019-11-30: 500 mg via ORAL
  Filled 2019-11-30: qty 1

## 2019-11-30 MED ORDER — LEVETIRACETAM 500 MG PO TABS
1500.0000 mg | ORAL_TABLET | Freq: Two times a day (BID) | ORAL | Status: DC
Start: 2019-11-30 — End: 2020-10-10

## 2019-11-30 NOTE — Plan of Care (Signed)
Have discussed seizures with patient. Patient has sort term memory deficit, so unsure of depth of understanding durHave discussed seizures with patient. Patient has sort term memory deficit, so unsure of depth of understanding during teaching.  Pt. States he does not have any advanced warning symptoms prior to having seizures.  Discussed the importance of taking seizure medication on a regular basis.  Pt. Verbalized understanding.  Pt. Is now resting comfortable in bed with bed alarm on and call bell within reach.  Pt. Is compliant with the use of the call bell.ing teaching.  Pt. States he does not have any advanced warning symptoms prior to having seizures.  Discussed the importance of taking seizure medication on a regular basis.  Pt. Verbalized understanding.  Pt. Is now resting comfortable in bed with bed alarm on and call bell within reach.  Pt. Is compliant with the use of the call bell.

## 2019-11-30 NOTE — Discharge Instructions (Signed)
Seizure, Adult A seizure is a sudden burst of abnormal electrical activity in the brain. Seizures usually last from 30 seconds to 2 minutes. They can cause many different symptoms. Usually, seizures are not harmful unless they last a long time. What are the causes? Common causes of this condition include:  Fever or infection.  Conditions that affect the brain, such as: ? A brain abnormality that you were born with. ? A brain or head injury. ? Bleeding in the brain. ? A tumor. ? Stroke. ? Brain disorders such as autism or cerebral palsy.  Low blood sugar.  Conditions that are passed from parent to child (are inherited).  Problems with substances, such as: ? Having a reaction to a drug or a medicine. ? Suddenly stopping the use of a substance (withdrawal). In some cases, the cause may not be known. A person who has repeated seizures over time without a clear cause has a condition called epilepsy. What increases the risk? You are more likely to get this condition if you have:  A family history of epilepsy.  Had a seizure in the past.  A brain disorder.  A history of head injury, lack of oxygen at birth, or strokes. What are the signs or symptoms? There are many types of seizures. The symptoms vary depending on the type of seizure you have. Examples of symptoms during a seizure include:  Shaking (convulsions).  Stiffness in the body.  Passing out (losing consciousness).  Head nodding.  Staring.  Not responding to sound or touch.  Loss of bladder control and bowel control. Some people have symptoms right before and right after a seizure happens. Symptoms before a seizure may include:  Fear.  Worry (anxiety).  Feeling like you may vomit (nauseous).  Feeling like the room is spinning (vertigo).  Feeling like you saw or heard something before (dj vu).  Odd tastes or smells.  Changes in how you see. You may see flashing lights or spots. Symptoms after a  seizure happens can include:  Confusion.  Sleepiness.  Headache.  Weakness on one side of the body. How is this treated? Most seizures will stop on their own in under 5 minutes. In these cases, no treatment is needed. Seizures that last longer than 5 minutes will usually need treatment. Treatment can include:  Medicines given through an IV tube.  Avoiding things that are known to cause your seizures. These can include medicines that you take for another condition.  Medicines to treat epilepsy.  Surgery to stop the seizures. This may be needed if medicines do not help. Follow these instructions at home: Medicines  Take over-the-counter and prescription medicines only as told by your doctor.  Do not eat or drink anything that may keep your medicine from working, such as alcohol. Activity  Do not do any activities that would be dangerous if you had another seizure, like driving or swimming. Wait until your doctor says it is safe for you to do them.  If you live in the [place] ask your local DMV (department of motor vehicles) when you can drive.  Get plenty of rest. Teaching others Teach friends and family what to do when you have a seizure. They should:  Lay you on the ground.  Protect your head and body.  Loosen any tight clothing around your neck.  Turn you on your side.  Not hold you down.  Not put anything into your mouth.  Know whether or not you need emergency care.  Stay  with you until you are better.  General instructions  Contact your doctor each time you have a seizure.  Avoid anything that gives you seizures.  Keep a seizure diary. Write down: ? What you think caused each seizure. ? What you remember about each seizure.  Keep all follow-up visits as told by your doctor. This is important. Contact a doctor if:  You have another seizure.  You have seizures more often.  There is any change in what happens during your seizures.  You keep having  seizures with treatment.  You have symptoms of being sick or having an infection. Get help right away if:  You have a seizure that: ? Lasts longer than 5 minutes. ? Is different than seizures you had before. ? Makes it harder to breathe. ? Happens after you hurt your head.  You have any of these symptoms after a seizure: ? Not being able to speak. ? Not being able to use a part of your body. ? Confusion. ? A bad headache.  You have two or more seizures in a row.  You do not wake up right after a seizure.  You get hurt during a seizure. These symptoms may be an emergency. Do not wait to see if the symptoms will go away. Get medical help right away. Call your local emergency services (911 in the [place] Do not drive yourself to the hospital. Summary  Seizures usually last from 30 seconds to 2 minutes. Usually, they are not harmful unless they last a long time.  Do not eat or drink anything that may keep your medicine from working, such as alcohol.  Teach friends and family what to do when you have a seizure.  Contact your doctor each time you have a seizure. This information is not intended to replace advice given to you by your health care provider. Make sure you discuss any questions you have with your health care provider. Document Revised: 03/31/2018 Document Reviewed: 03/31/2018 Elsevier Patient Education  [place].

## 2019-11-30 NOTE — NC FL2 (Signed)
[name]     IDENTIFICATION  Patient Name: Alexander Henderson Birthdate: May 19, 1960 Sex: male Admission Date (Current Location): 11/29/2019  [facility] [place] Number:  Herbalist and Address:  [facility] [name], [place] [address] [place], [place] [address]      Provider Number: [id number]  Attending Physician Name and Address:  Axel Filler, *  Relative Name and Phone Number:  [name],[phone]    Current Level of Care: Hospital Recommended Level of Care: [place] Prior Approval Number:    Date Approved/Denied:   PASRR Number: [id number]  Discharge Plan: SNF    Current Diagnoses: Patient Active Problem List   Diagnosis Date Noted  . Gait abnormality 09/01/2016  . Seizures ([place]) 09/01/2016  . Anoxic brain damage ([place]) 09/01/2016  . Anoxic encephalopathy ([place]) 04/07/2016  . Dysphagia 04/07/2016  . Essential hypertension 04/07/2016  . Seizure disorder ([place]) 04/07/2016  . Gastroesophageal reflux disease 04/07/2016  . Depression 04/07/2016  . History of alcohol use 04/07/2016  . Ulcerative colitis ([place]) 04/07/2016  . Hyperlipidemia 04/07/2016    Orientation RESPIRATION BLADDER Height & Weight     Self, Place  Normal External catheter Weight: 205 lb 7.5 oz (93.2 kg) Height:  6' 0.01" (182.9 cm)  BEHAVIORAL SYMPTOMS/MOOD NEUROLOGICAL BOWEL NUTRITION STATUS      Continent Diet (See discharge Summary)  AMBULATORY STATUS COMMUNICATION OF NEEDS Skin   Limited Assist Verbally Normal                       Personal Care Assistance Level of Assistance  Bathing, Feeding, Dressing Bathing Assistance: Limited assistance Feeding assistance: Independent Dressing Assistance: Limited assistance     Functional Limitations Info  Sight, Hearing, Speech Sight Info: Adequate Hearing Info: Adequate Speech Info: Adequate    SPECIAL CARE FACTORS FREQUENCY  PT (By  licensed PT), OT (By licensed OT)     PT Frequency: 5x week OT Frequency: 5x week            Contractures Contractures Info: Not present    Additional Factors Info  Code Status, Allergies, Psychotropic Code Status Info: DNR Allergies Info: Fluconazole Psychotropic Info: Sertraline         Current Medications (11/30/2019):  This is the current hospital active medication list Current Facility-Administered Medications  Medication Dose Route Frequency Provider Last Rate Last Admin  . acetaminophen (TYLENOL) tablet 650 mg  650 mg Oral Q4H PRN Aslam, Loralyn [place], MD       Or  . acetaminophen (TYLENOL) suppository 650 mg  650 mg Rectal Q4H PRN Aslam, Sadia, MD      . enoxaparin (LOVENOX) injection 40 mg  40 mg Subcutaneous Q24H Aslam, Sadia, MD   40 mg at 11/29/19 1554  . feeding supplement (BOOST / RESOURCE BREEZE) liquid 1 Container  1 Container Oral TID BM Axel Filler, MD   1 Container at 11/30/19 1010  . lacosamide (VIMPAT) tablet 200 mg  200 mg Oral BID Lora Havens, MD   200 mg at 11/30/19 1009  . levETIRAcetam (KEPPRA) tablet 1,500 mg  1,500 mg Oral BID Lora Havens, MD   1,500 mg at 11/30/19 1009  . levETIRAcetam (KEPPRA) tablet 500 mg  500 mg Oral Once Mosetta Anis, MD      . mesalamine (LIALDA) EC tablet 2.4 g  2.4 g Oral Q breakfast Aslam, Sadia, MD   2.4 g at 11/30/19 1010  .  mirtazapine (REMERON) tablet 7.5 mg  7.5 mg Oral QHS Aslam, Sadia, MD   7.5 mg at 11/29/19 2231  . nystatin (MYCOSTATIN/NYSTOP) topical powder 1 application  1 application Topical BID Harvie Heck, MD   1 application at 41/59/73 1010  . ondansetron (ZOFRAN) tablet 4 mg  4 mg Oral Q6H PRN Aslam, Sadia, MD       Or  . ondansetron (ZOFRAN) injection 4 mg  4 mg Intravenous Q6H PRN Aslam, Sadia, MD      . polyethylene glycol (MIRALAX / GLYCOLAX) packet 17 g  17 g Oral Daily PRN Aslam, Sadia, MD      . sertraline (ZOLOFT) tablet 100 mg  100 mg Oral Daily Aslam, Sadia, MD   100 mg at 11/30/19  1009     Discharge Medications: Please see discharge summary for a list of discharge medications.  Relevant Imaging Results:  Relevant Lab Results:   Additional Information SS# Butner, LCSWA

## 2019-11-30 NOTE — TOC Transition Note (Signed)
Transition of Care [facility]) - CM/SW Discharge Note   Patient Details  Name: Alexander Henderson MRN: [id number] Date of Birth: 1960/06/21  [facility]) CM/SW Contact:  Coralee Pesa, [place] Phone Number: 11/30/2019, 4:05 PM   Clinical Narrative:    Room 201 A Nurse to call report to [phone] ask for 200 hall nurses station.   Final next level of care: Skilled Nursing Facility Barriers to Discharge: Other (comment) (SNF pending facility receiving paperwork from spouse)   Patient Goals and CMS Choice     Choice offered to / list presented to : Spouse  Discharge Placement              Patient chooses bed at: [facility] Patient to be transferred to facility by: [facility] Name of family member notified: Zymir Napoli Patient and family notified of of transfer: 11/30/19  Discharge Plan and Services                                     Social Determinants of Health (SDOH) Interventions     Readmission Risk Interventions No flowsheet data found.

## 2019-11-30 NOTE — Progress Notes (Signed)
Patient's urinalysis is negative for infection.  There is no clear etiology for a reduced seizure threshold.  Therefore we will increase his evening dose of Keppra to 2000 mg nightly and keep the morning dose of 1500 mg, for a total daily dose of 3500 mg.  I have placed an order for outpatient follow-up with Dr. Krista Blue in 8 to 12 weeks  Please provide patient with contact information for the office in case the office is unable to reach the patient: [address], [place], [place] [address]--[phone]

## 2019-11-30 NOTE — Evaluation (Addendum)
Clinical/Bedside Swallow Evaluation Patient Details  Name: Alexander Henderson MRN: [id number] Date of Birth: [birth year]  Today's Date: 11/30/2019 Time: SLP Start Time (ACUTE ONLY): 0750 SLP Stop Time (ACUTE ONLY): 0820 SLP Time Calculation (min) (ACUTE ONLY): 30 min  Past Medical History:  Past Medical History:  Diagnosis Date  . Alcohol use   . Anoxic encephalopathy ([place])    secondary to cardiac arrest on 01/03/16.  [place] Kitchen Anxiety   . Depression, major   . Diverticulosis   . GERD (gastroesophageal reflux disease)   . Hyperlipidemia   . Hypertension   . Mobility impaired   . Parkinsonism ([place])   . 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 Recurrent falls   . Seizures ([place])   . Ulcerative colitis [facility])    Past Surgical History:  Past Surgical History:  Procedure Laterality Date  . 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 INGUINAL HERNIA REPAIR Right   . IR GASTROSTOMY TUBE REMOVAL  08/24/2016  . testical surgery     removal  . VASECTOMY     HPI:  59 yo male adm to [facility] from SNF with concern for seizure.  Pt PMH + for anoxic BI from seizure with cardiac arrest s/p PEG and trach in 2017 - since removed, HI 2019, CT head negative.  Swallow eval ordered.   Assessment / Plan / Recommendation Clinical Impression  Pt with functional oropharyngeal swallow despite his abrasion on the right lateral tongue from biting his tongue.  His swallow clinically appears strong without indications of retention.  He is able to self feed which will maximize his airway protection, especially due to his delayed swallow at times.  Suspect delay in swallow is cognitive based.  He is moderately slow to respond due to his BI and admits he is weaker than normal.  Pt did not pass [facility] 3 ounce water test as he did not follow directions for task with repeat verbal instructions.  Recommend regular/thin diet.  Informed pt of recommendation to  masticate on left side due to abrasion and avoid hot/spicy foods until his tongue heals.  Provided swallow precaution signs using teach back, no SLP follow up needed.  Pt may not recall recommendations but given his awareness/sensation of pain with lingual abrasion, he will likely modify appropriately.    SLP Visit Diagnosis: Dysphagia, unspecified (R13.10)    Aspiration Risk  Mild aspiration risk    Diet Recommendation Regular;Thin liquid   Liquid Administration via: Cup;Straw Medication Administration: Whole meds with liquid Supervision: Patient able to self feed Compensations: Slow rate;Small sips/bites;Other (Comment) (try to chew on left side) Postural Changes: Seated upright at 90 degrees;Remain upright for at least 30 minutes after po intake    Other  Recommendations Oral Care Recommendations: Oral care BID   Follow up Recommendations None n/a     Frequency and Duration   n/a         Prognosis   n/a     Swallow Study   General HPI: 59 yo male adm to [facility] from SNF with concern for seizure.  Pt PMH + for anoxic BI from seizure with cardiac arrest s/p PEG and trach in 2018 - since removed, HI 2019, CT head negative.  Swallow eval ordered. Type of Study: Bedside Swallow Evaluation Diet Prior to this Study: Thin liquids (clears) Temperature Spikes Noted: No Respiratory Status: Room air History of Recent Intubation: No Behavior/Cognition: Alert;Cooperative;Pleasant mood  Oral Cavity Assessment: Other (comment) (abrasion on right lateral lingual surface due to pt biting tongue with seizure) Oral Care Completed by SLP: No Oral Cavity - Dentition: Adequate natural dentition Vision: Functional for self-feeding Self-Feeding Abilities: Able to feed self;Needs set up Patient Positioning: Upright in bed Baseline Vocal Quality: Low vocal intensity Volitional Cough: Weak Volitional Swallow: Able to elicit    Oral/Motor/Sensory Function Overall Oral Motor/Sensory Function:  Generalized oral weakness   Ice Chips Ice chips: Not tested   Thin Liquid Thin Liquid: Within functional limits Presentation: Cup;Self Fed;Straw    Nectar Thick Nectar Thick Liquid: Not tested   Honey Thick Honey Thick Liquid: Not tested   Puree Puree: Within functional limits Presentation: Self Fed;Spoon   Solid     Solid: Within functional limits Presentation: Self Fed Other Comments: graham crackers      Alexander Henderson 11/30/2019,8:28 AM  Kathleen Lime, MS [facility] SLP [place] Office [phone] Pager [phone]

## 2019-11-30 NOTE — Progress Notes (Signed)
Physical Therapy Evaluation Patient Details Name: Alexander Henderson MRN: [id number] DOB: 06-28-1960 Today's Date: 11/30/2019   History of Present Illness  Pt is a 59 year old male who presented to the hospital the first time following a seizure that lasted 2 minutes, and then he was discharged but returned later in day following another seizure that lasted ~3 minutes and was generalized tonic-clonic. He has a hx of epilepsy and does not communicate other than yes or no questions at baseline. CT of head negative for acute abnormalities. Medical hx significant for anoxic brain injury, seizures, hx of falls, cardiac arrest, parkinsonism, and HTN.  Clinical Impression  Pt is limited in standing and gait by back pain this date. He is a poor historian as he reports not remembering where he lived or what amount of assistance he required prior to his recent seizures. Attempted to contact wife via phone to obtain functional mobility and living arrangements hx, without success. Per chart, he was previously residing at [facility]. Pt is able to perform all bed mobility, transfers, and gait with minA, but displays shuffling and trunk sway in standing and with gait. He demonstrates possible LE coordination deficits, slow processing, decreased initiation, decreased problem-solving, impaired balance, and safety/judgement deficits that place him at risk for falls. Unaware if this may be his baseline as he has a hx of anoxic brain injury. Will continue to follow acutely and plan for pt to return to SNF upon d/c to address his deficits to maximize his independence and safety with all functional mobility.    Follow Up Recommendations SNF;Supervision/Assistance - 24 hour    Equipment Recommendations  Rolling walker with 5" wheels;3in1 (PT);Wheelchair (measurements PT);Wheelchair cushion (measurements PT) (may change as pt progresses)    Recommendations for Other Services       Precautions /  Restrictions Precautions Precautions: Fall Restrictions Weight Bearing Restrictions: No      Mobility  Bed Mobility Overal bed mobility: Needs Assistance Bed Mobility: Rolling;Sidelying to Sit;Sit to Supine Rolling: Min guard Sidelying to sit: Min assist   Sit to supine: Min assist   General bed mobility comments: HOB flat, utilizing bed rails, requiring extra time and cues and assistance to manage trunk with ascent and descent.     Transfers Overall transfer level: Needs assistance Equipment used: Rolling walker (2 wheeled) Transfers: Sit to/from Stand Sit to Stand: Min assist         General transfer comment: STS 2x from EOB, cuing pt to place hands on bed rather than RW, with success on 2nd rep. Slow to power up to stand and slight unsteadiness noted, requiring minA for safety.  Ambulation/Gait Ambulation/Gait assistance: Min assist Gait Distance (Feet): 3 Feet Assistive device: Rolling walker (2 wheeled) Gait Pattern/deviations: Shuffle;Decreased stride length Gait velocity: decreased Gait velocity interpretation: <1.31 ft/sec, indicative of household ambulator General Gait Details: Pt requires cues to inc step length and feet clearance bilaterally, with min success as pt would report and display back pain limiting him from ambulating further. MinA to maintain safety due to trunk sway and unsteadiness.  Stairs            Wheelchair Mobility    Modified Rankin (Stroke Patients Only)       Balance Overall balance assessment: Needs assistance Sitting-balance support: Bilateral upper extremity supported;Feet supported Sitting balance-Leahy Scale: Fair Sitting balance - Comments: Pt able to remove hands sitting statically EOB with min guard assist without LOB.   Standing balance support: Bilateral upper  extremity supported;During functional activity Standing balance-Leahy Scale: Poor Standing balance comment: Trunk sway and unsteadiness noted with B UE  support on RW, requiring minA for safety.                             Pertinent Vitals/Pain Pain Assessment: Faces Pain Score: 3  Faces Pain Scale: Hurts whole lot Pain Location: "in general" everywhere for reported 3/10; later displayed inc pain in lower back with mobility Pain Descriptors / Indicators: Dull;Grimacing;Guarding Pain Intervention(s): Limited activity within patient's tolerance;Monitored during session;Patient requesting pain meds-RN notified    Home Living Family/patient expects to be discharged to:: Assisted living   was recently residing at [place] [facility], per chart             Home Equipment: None (pt reports none, but poor historian) Additional Comments: Attempted to call wife to obtain PLOF and prior living setting hx, but no answer via phone. Pt poor historian.    Prior Function Level of Independence: Needs assistance   Gait / Transfers Assistance Needed: Assumed minA-supervision with gait  ADL's / Homemaking Assistance Needed: Assumed assistance needed   Comments: Pt poor historian, unable to connect with wife via phone.     Hand Dominance        Extremity/Trunk Assessment   Upper Extremity Assessment Upper Extremity Assessment: Defer to OT evaluation    Lower Extremity Assessment Lower Extremity Assessment: RLE deficits/detail;LLE deficits/detail RLE Deficits / Details: MMT scores of 4+ to 5 grossly throughout; difficulty inc speed with feet taps but able to tap simultaneously at slow speed RLE Sensation: WNL RLE Coordination: decreased gross motor;decreased fine motor (slow with feet tapping) LLE Deficits / Details: MMT scores of 4+ to 5 grossly throughout; difficulty inc speed with feet taps but able to tap simultaneously at slow speed LLE Sensation: WNL LLE Coordination: decreased fine motor;decreased gross motor (slow with feet tapping)       Communication   Communication: No difficulties  Cognition  Arousal/Alertness: Awake/alert Behavior During Therapy: Flat affect Overall Cognitive Status: No family/caregiver present to determine baseline cognitive functioning                                 General Comments: Pt able to state full name, DOB, location, month, and year, but unable to state exact date and situation. Pt reports not remembering where he lived or what assistance he needed prior to hospitalization. Slow processing and initiation and cues required to maintain safety.      General Comments      Exercises     Assessment/Plan    PT Assessment Patient needs continued PT services  PT Problem List Decreased activity tolerance;Decreased balance;Decreased mobility;Decreased coordination;Decreased cognition;Decreased knowledge of use of DME;Decreased safety awareness;Pain       PT Treatment Interventions DME instruction;Gait training;Stair training;Functional mobility training;Therapeutic activities;Therapeutic exercise;Balance training;Neuromuscular re-education;Cognitive remediation;Patient/family education    PT Goals (Current goals can be found in the Care Plan section)  Acute Rehab PT Goals Patient Stated Goal: to get better PT Goal Formulation: With patient Time For Goal Achievement: 12/07/19 Potential to Achieve Goals: Good    Frequency Min 3X/week   Barriers to discharge        Co-evaluation               AM-PAC PT "6 Clicks" Mobility  Outcome Measure Help needed turning from  your back to your side while in a flat bed without using bedrails?: A Little Help needed moving from lying on your back to sitting on the side of a flat bed without using bedrails?: A Little Help needed moving to and from a bed to a chair (including a wheelchair)?: A Little Help needed standing up from a chair using your arms (e.g., wheelchair or bedside chair)?: A Little Help needed to walk in hospital room?: A Little Help needed climbing 3-5 steps with a railing?  : A Lot 6 Click Score: 17    End of Session Equipment Utilized During Treatment: Gait belt Activity Tolerance: Patient tolerated treatment well;Patient limited by pain Patient left: in bed;with call bell/phone within reach;with bed alarm set Nurse Communication: Patient requests pain meds PT Visit Diagnosis: Unsteadiness on feet (R26.81);Other abnormalities of gait and mobility (R26.89);Difficulty in walking, not elsewhere classified (R26.2);Pain Pain - Right/Left:  (back) Pain - part of body:  (back)    Time: 5284-1324 PT Time Calculation (min) (ACUTE ONLY): 28 min   Charges:   PT Evaluation $PT Eval Moderate Complexity: 1 Mod PT Treatments $Therapeutic Activity: 8-22 mins        Moishe Spice, PT, DPT Acute Rehabilitation Services  Pager: [phone] Office: [phone]   Orvan Falconer 11/30/2019, 11:37 AM

## 2019-11-30 NOTE — Progress Notes (Signed)
OT Discharge Note  Patient Details Name: Alexander Henderson MRN: [id number] DOB: 09/30/1960   Cancelled Treatment:    Reason Eval/Treat Not Completed: Other (comment) (sign off acutely / dc to SNF today long term care resident) Spoke with RN and transitional care and will d/c to SNF. OT to defer any further workup to SNF at this time.   Billey Chang, OTR/L  Acute Rehabilitation Services Pager: [phone] Office: [phone] .  11/30/2019, 3:19 PM

## 2019-11-30 NOTE — Discharge Summary (Signed)
Name: Alexander Henderson MRN: [id number] DOB: 04/25/1960 59 y.o. PCP: Marcial Pacas, MD  Date of Admission: 11/29/2019  9:03 AM Date of Discharge: 11/30/2019 Attending Physician: Axel Filler, *  Discharge Diagnosis: 1. Breakthrough seizures  Discharge Medications: Allergies as of 11/30/2019      Reactions   Fluconazole Hives, Rash      Medication List    STOP taking these medications   carbidopa-levodopa 25-100 MG tablet Commonly known as: SINEMET IR   donepezil 10 MG tablet Commonly known as: ARICEPT     TAKE these medications   acetaminophen 500 MG tablet Commonly known as: TYLENOL Take 500 mg by mouth every 8 (eight) hours as needed for mild pain or fever.   bisacodyl 10 MG suppository Commonly known as: DULCOLAX Place 10 mg rectally See admin instructions. Give 1 suppository (10 mg totally) rectally 1 dose in 24 hours as needed if not relief by Milk of Magnesium   calcium-vitamin D 500-200 MG-UNIT tablet Commonly known as: OSCAL WITH D Take 1 tablet by mouth 2 (two) times daily.   lacosamide 200 MG Tabs tablet Commonly known as: Vimpat Take 1 tablet (200 mg total) by mouth 2 (two) times daily.   levETIRAcetam 500 MG tablet Commonly known as: KEPPRA Take 3-4 tablets (1,500-2,000 mg total) by mouth 2 (two) times daily. Take 1500 in am, 2058m in pm What changed:   medication strength  how much to take  additional instructions   mesalamine 1.2 g EC tablet Commonly known as: LIALDA Take 2.4 g by mouth daily with breakfast.   MILK OF MAGNESIA PO Take 30 mLs by mouth See admin instructions. Give 30 ml Milk of Magnesium by mouth 1 time in 24 hours as needed of no bowel movement in 3 days   mirtazapine 7.5 MG tablet Commonly known as: REMERON Take 7.5 mg by mouth at bedtime.   nystatin powder Generic drug: nystatin Apply 1 application topically 2 (two) times daily. To groin   RA SALINE ENEMA RE Place 1 each rectally See admin instructions. Give  1 dose rectally disposable Saline Enema in 24 hours as needed if not relief by Bisacodyl suppository   sennosides-docusate sodium 8.6-50 MG tablet Commonly known as: SENOKOT-S Take 2 tablets by mouth at bedtime as needed for constipation.   sertraline 100 MG tablet Commonly known as: ZOLOFT Take 100 mg by mouth daily.   zinc oxide 11.3 % Crea cream Commonly known as: BALMEX Apply 1 application topically See admin instructions. Apply into the bottom and perineal area 6 times a days as needed for redness       Disposition and follow-up:   [facility]  At the hospital follow up visit please address:  1.  Breakthrough seizures: No clear etiology for reduced seizure threshold. Will be important to make sure patient continues to take medication as prescribed. Increased evening Keppra dose to 20013m  2.  Labs / imaging needed at time of follow-up: CBC, CMP,  levetiracetam level  3.  Pending labs/ test needing follow-up: Levetiracetam, lacrosamide levels  Follow-up Appointments:  Follow-up Information    YaMarcial PacasMD. Call.   Specialty: Neurology Contact information: [address] [place] an appointment as soon as possible for a visit.   Why: Your PCP is aware of your discharge date and will call you  for a follow up appointment Contact information: [place] [address] [phone]               Hospital Course by problem list: 1. Breakthrough seizures: Patient presented with multiple episodes of seizure-like activity at [place], reportedly compliant with VIMPAT and Keppra. Work-up unrevealing for source of breakthrough seizure. Increasing evening dose of Keppra, will follow-up with neurology.   Discharge Vitals:   BP 118/81 (BP Location: Left Arm)   Pulse (!) 59    Temp 98.2 F (36.8 C) (Oral)   Resp 16   SpO2 95%   Pertinent Labs, Studies, and Procedures:  CBC Latest Ref Rng & Units 11/30/2019 11/29/2019 11/29/2019  WBC 4.0 - 10.5 K/uL 8.2 8.7 -  Hemoglobin 13.0 - 17.0 g/dL 14.2 15.1 13.9  Hematocrit 39 - 52 % 41.1 44.5 41.0  Platelets 150 - 400 K/uL 219 236 -   CMP Latest Ref Rng & Units 11/29/2019 11/29/2019 11/29/2019  Glucose 70 - 99 mg/dL 101(H) 97 96  BUN 6 - 20 mg/dL 10 10 13   Creatinine 0.61 - 1.24 mg/dL 0.78 1.02 0.80  Sodium 135 - 145 mmol/L 138 138 140  Potassium 3.5 - 5.1 mmol/L 3.8 3.5 3.9  Chloride 98 - 111 mmol/L 108 103 106  CO2 22 - 32 mmol/L 23 24 -  Calcium 8.9 - 10.3 mg/dL 8.5(L) 8.8(L) -  Total Protein 6.5 - 8.1 g/dL 5.8(L) 6.2(L) -  Total Bilirubin 0.3 - 1.2 mg/dL 0.5 0.4 -  Alkaline Phos 38 - 126 U/L 72 74 -  AST 15 - 41 U/L 16 18 -  ALT 0 - 44 U/L 14 15 -   CTH 11/4:  Mild generalized atrophy. No acute abnormality.  Discharge Instructions: Discharge Instructions    Ambulatory referral to Neurology   Complete by: As directed    An appointment is requested in approximately: 8-12 weeks     Signed: Sanjuan Dame, MD 11/30/2019, 2:24 PM   Pager: [phone]

## 2019-11-30 NOTE — Hospital Course (Signed)
Problems addressed:  Breakthrough seizures The patient's pertinent PMHx includes (but is not limited to) chronic disability due to a prior anoxic brain injury, seizure disorder, resides in a skilled nursing facility, is a member of the [facility], admitted to our service for multiple breakthrough seizures in 1 day despite good adherence to antiepileptic medications. He does appear to be chronically ill and deconditioned, with no focal deficits or signs of infection.  Labs are reassuring without significant electrolyte abnormality.  No signs of ongoing seizure activity. Treatment included continuing his VIMPAT tablet 200 mg BID by mouth, and Keppra tablet 1,500 mg BID by mouth. Neurology was consulted and recommends increasing his evening dose of Keppra to 2000 mg nightly and keep the morning dose of 1500 mg, for a total daily dose of 3500 mg. Patient should follow up with his neurologist, Dr. Krista Blue in 8 to 12 weeks.

## 2019-11-30 NOTE — Progress Notes (Signed)
° °  Subjective:  No acute events over night. No signs of ongoing seizures.   Patient was seen and examined at bedside. The RN was in the room. The patient had just fallen back to sleep. He woke up when spoken too. He was able to state his name and DOB, point to the white board and say he was at [facility]. He said he wasn't sure why he was at the hospital. He responded to verbal commands. He denied any headaches, CP, abdominal pain, and SOB.   Objective:  Vital signs in last 24 hours: Vitals:   11/29/19 1937 11/30/19 0016 11/30/19 0359 11/30/19 1254  BP: 115/72 112/80 112/77 118/81  Pulse: (!) 56 62 (!) 59 (!) 59  Resp: 18 18  16   Temp: 97.6 F (36.4 C) 98.3 F (36.8 C) 98.2 F (36.8 C)   TempSrc: Oral  Oral   SpO2: 99% 93% 97% 95%   Weight change:   Intake/Output Summary (Last 24 hours) at 11/30/2019 1353 Last data filed at 11/30/2019 0015 Gross per 24 hour  Intake 1000 ml  Output 600 ml  Net 400 ml   Physical Exam: General: NAD, well-appearing, alert, interactive CV: RRR, no murmurs/rubs/gallops, nl S1/S2 Pulm: normal respiratory efforts, no wheezes/rhonchi/rales Neuro: Patient is alert and awake, oriented to self. No focal deficits.    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     Breakthrough seizures On examination, patient is awake and alert; however, only oriented to self at this time. No focal deficits noted on examination at this time. Currently, no concern for status epilepticus. Unclear etiology of his break through seizures; however, suspect that it may be related to medications. Patient resumed on  home Vimpat dosing and his Keppra PM dosing was increased. Patient is stable for discharge back to SNF.   - Neurology consulted, appreciate their recommendations  - TSH (.790) - UA negative for infection - Glucose (101) - Valproic Acid (<10) - Levetiracetam level still pending - Lacosamide level still pending - VIMPAT 200 mg PO BID - Keppra 1500 mg in AM and 20107m in PM, for a total daily dose of 3500 mg  - PT/OT evaluation supports return to SNF facility for required assistance - Passed swallow, advanced to clear liquid diet - Monitor I/Os - Outpatient f/u with neurologist, Dr. YKrista Bluein 8 to 12 weeks  Ulcerative Colitis According to the patient's wife, his ulcerative colitis has been well-controlled on current dosing of mesalamine.  - Mesalamine 2.4 g PO daily with breakfast  Essential hypertension According to the patient's wife, the patient was treated for hypertension in the past, but is no longer taking medications. Soon after his brain injury the patient lost weight and this issue was resolved.   LOS: 0 days   PLytle Michaels Medical Student 11/30/2019, 1:53 PM

## 2019-12-02 LAB — LACOSAMIDE: Lacosamide: 3.9 ug/mL — ABNORMAL LOW (ref 5.0–10.0)

## 2019-12-03 LAB — LEVETIRACETAM LEVEL: Levetiracetam Lvl: 22.6 ug/mL (ref 10.0–40.0)

## 2020-02-11 ENCOUNTER — Ambulatory Visit: Payer: Medicare (Managed Care) | Admitting: Neurology

## 2020-04-21 ENCOUNTER — Ambulatory Visit: Payer: Medicare (Managed Care) | Admitting: Neurology

## 2020-10-07 ENCOUNTER — Other Ambulatory Visit: Payer: Self-pay

## 2020-10-07 ENCOUNTER — Encounter (HOSPITAL_COMMUNITY): Payer: Self-pay

## 2020-10-07 ENCOUNTER — Emergency Department (HOSPITAL_COMMUNITY): Payer: Medicare (Managed Care)

## 2020-10-07 ENCOUNTER — Inpatient Hospital Stay (HOSPITAL_COMMUNITY)
Admission: EM | Admit: 2020-10-07 | Discharge: 2020-10-10 | DRG: 101 | Disposition: A | Discharge status: Skilled Nursing Facility | Payer: Medicare (Managed Care) | Source: Skilled Nursing Facility | Attending: Internal Medicine | Admitting: Internal Medicine

## 2020-10-07 DIAGNOSIS — R569 Unspecified convulsions: Secondary | ICD-10-CM

## 2020-10-07 DIAGNOSIS — I10 Essential (primary) hypertension: Secondary | ICD-10-CM | POA: Diagnosis present

## 2020-10-07 DIAGNOSIS — F1010 Alcohol abuse, uncomplicated: Secondary | ICD-10-CM | POA: Diagnosis present

## 2020-10-07 DIAGNOSIS — M549 Dorsalgia, unspecified: Secondary | ICD-10-CM | POA: Diagnosis present

## 2020-10-07 DIAGNOSIS — G8929 Other chronic pain: Secondary | ICD-10-CM | POA: Diagnosis present

## 2020-10-07 DIAGNOSIS — Z79899 Other long term (current) drug therapy: Secondary | ICD-10-CM | POA: Diagnosis not present

## 2020-10-07 DIAGNOSIS — K5190 Ulcerative colitis, unspecified, without complications: Secondary | ICD-10-CM | POA: Diagnosis present

## 2020-10-07 DIAGNOSIS — F329 Major depressive disorder, single episode, unspecified: Secondary | ICD-10-CM | POA: Diagnosis present

## 2020-10-07 DIAGNOSIS — G20 Parkinson's disease: Secondary | ICD-10-CM | POA: Diagnosis present

## 2020-10-07 DIAGNOSIS — G40509 Epileptic seizures related to external causes, not intractable, without status epilepticus: Principal | ICD-10-CM | POA: Diagnosis present

## 2020-10-07 DIAGNOSIS — Z89021 Acquired absence of right finger(s): Secondary | ICD-10-CM

## 2020-10-07 DIAGNOSIS — E785 Hyperlipidemia, unspecified: Secondary | ICD-10-CM | POA: Diagnosis present

## 2020-10-07 DIAGNOSIS — F419 Anxiety disorder, unspecified: Secondary | ICD-10-CM | POA: Diagnosis present

## 2020-10-07 DIAGNOSIS — K219 Gastro-esophageal reflux disease without esophagitis: Secondary | ICD-10-CM | POA: Diagnosis present

## 2020-10-07 DIAGNOSIS — Z888 Allergy status to other drugs, medicaments and biological substances status: Secondary | ICD-10-CM

## 2020-10-07 DIAGNOSIS — K51919 Ulcerative colitis, unspecified with unspecified complications: Secondary | ICD-10-CM | POA: Diagnosis not present

## 2020-10-07 DIAGNOSIS — G931 Anoxic brain damage, not elsewhere classified: Secondary | ICD-10-CM | POA: Diagnosis not present

## 2020-10-07 DIAGNOSIS — F32A Depression, unspecified: Secondary | ICD-10-CM | POA: Diagnosis present

## 2020-10-07 DIAGNOSIS — Z8674 Personal history of sudden cardiac arrest: Secondary | ICD-10-CM | POA: Diagnosis not present

## 2020-10-07 DIAGNOSIS — G40909 Epilepsy, unspecified, not intractable, without status epilepticus: Secondary | ICD-10-CM

## 2020-10-07 DIAGNOSIS — Z20822 Contact with and (suspected) exposure to covid-19: Secondary | ICD-10-CM | POA: Diagnosis present

## 2020-10-07 DIAGNOSIS — W19XXXA Unspecified fall, initial encounter: Secondary | ICD-10-CM

## 2020-10-07 LAB — CBC
HCT: 43.2 % (ref 39.0–52.0)
Hemoglobin: 14.7 g/dL (ref 13.0–17.0)
MCH: 31.1 pg (ref 26.0–34.0)
MCHC: 34 g/dL (ref 30.0–36.0)
MCV: 91.5 fL (ref 80.0–100.0)
Platelets: 235 10*3/uL (ref 150–400)
RBC: 4.72 MIL/uL (ref 4.22–5.81)
RDW: 12.3 % (ref 11.5–15.5)
WBC: 10.4 10*3/uL (ref 4.0–10.5)
nRBC: 0 % (ref 0.0–0.2)

## 2020-10-07 LAB — CBC WITH DIFFERENTIAL/PLATELET
Abs Immature Granulocytes: 0.04 10*3/uL (ref 0.00–0.07)
Basophils Absolute: 0 10*3/uL (ref 0.0–0.1)
Basophils Relative: 0 %
Eosinophils Absolute: 0.3 10*3/uL (ref 0.0–0.5)
Eosinophils Relative: 2 %
HCT: 45.5 % (ref 39.0–52.0)
Hemoglobin: 15.4 g/dL (ref 13.0–17.0)
Immature Granulocytes: 0 %
Lymphocytes Relative: 31 %
Lymphs Abs: 3.6 10*3/uL (ref 0.7–4.0)
MCH: 31.3 pg (ref 26.0–34.0)
MCHC: 33.8 g/dL (ref 30.0–36.0)
MCV: 92.5 fL (ref 80.0–100.0)
Monocytes Absolute: 0.8 10*3/uL (ref 0.1–1.0)
Monocytes Relative: 7 %
Neutro Abs: 7 10*3/uL (ref 1.7–7.7)
Neutrophils Relative %: 60 %
Platelets: 244 10*3/uL (ref 150–400)
RBC: 4.92 MIL/uL (ref 4.22–5.81)
RDW: 12.2 % (ref 11.5–15.5)
WBC: 11.7 10*3/uL — ABNORMAL HIGH (ref 4.0–10.5)
nRBC: 0 % (ref 0.0–0.2)

## 2020-10-07 LAB — BASIC METABOLIC PANEL
Anion gap: 8 (ref 5–15)
BUN: 13 mg/dL (ref 6–20)
CO2: 25 mmol/L (ref 22–32)
Calcium: 9.1 mg/dL (ref 8.9–10.3)
Chloride: 105 mmol/L (ref 98–111)
Creatinine, Ser: 0.72 mg/dL (ref 0.61–1.24)
GFR, Estimated: >60 mL/min (ref >60)
Glucose, Bld: 93 mg/dL (ref 70–99)
Potassium: 4.1 mmol/L (ref 3.5–5.1)
Sodium: 138 mmol/L (ref 135–145)

## 2020-10-07 LAB — SARS CORONAVIRUS 2 (TAT 6-24 HRS): SARS Coronavirus 2: NEGATIVE

## 2020-10-07 LAB — MAGNESIUM: Magnesium: 2.1 mg/dL (ref 1.7–2.4)

## 2020-10-07 LAB — CREATININE, SERUM
Creatinine, Ser: 0.78 mg/dL (ref 0.61–1.24)
GFR, Estimated: >60 mL/min (ref >60)

## 2020-10-07 LAB — CBG MONITORING, ED: Glucose-Capillary: 103 mg/dL — ABNORMAL HIGH (ref 70–99)

## 2020-10-07 LAB — PHOSPHORUS: Phosphorus: 2 mg/dL — ABNORMAL LOW (ref 2.5–4.6)

## 2020-10-07 MED ORDER — ENOXAPARIN SODIUM 40 MG/0.4ML IJ SOSY
40.0000 mg | PREFILLED_SYRINGE | INTRAMUSCULAR | Status: DC
  Administered 2020-10-07 – 2020-10-09 (×3): 40 mg via SUBCUTANEOUS
  Filled 2020-10-07 (×3): qty 0.4

## 2020-10-07 MED ORDER — SODIUM CHLORIDE 0.9% FLUSH
3.0000 mL | Freq: Two times a day (BID) | INTRAVENOUS | Status: DC
  Administered 2020-10-07 – 2020-10-08 (×4): 3 mL via INTRAVENOUS

## 2020-10-07 MED ORDER — ACETAMINOPHEN 325 MG PO TABS
650.0000 mg | ORAL_TABLET | Freq: Four times a day (QID) | ORAL | Status: DC | PRN
  Administered 2020-10-07 – 2020-10-09 (×4): 650 mg via ORAL
  Filled 2020-10-07 (×4): qty 2

## 2020-10-07 MED ORDER — LORAZEPAM 2 MG/ML IJ SOLN
INTRAMUSCULAR | Status: AC
  Administered 2020-10-07: 2 mg
  Filled 2020-10-07: qty 1

## 2020-10-07 MED ORDER — ACETAMINOPHEN 650 MG RE SUPP: 650.0000 mg | Freq: Four times a day (QID) | RECTAL | Status: DC | PRN

## 2020-10-07 MED ORDER — LEVETIRACETAM IN NACL 1000 MG/100ML IV SOLN
1000.0000 mg | Freq: Once | INTRAVENOUS | Status: AC
  Administered 2020-10-07: 1000 mg via INTRAVENOUS
  Filled 2020-10-07: qty 100

## 2020-10-07 MED ORDER — SODIUM PHOSPHATES 45 MMOLE/15ML IV SOLN
15.0000 mmol | Freq: Once | INTRAVENOUS | Status: AC
  Administered 2020-10-07: 15 mmol via INTRAVENOUS
  Filled 2020-10-07: qty 5

## 2020-10-07 NOTE — ED Notes (Addendum)
This RN was notified by housekeeping that pt was seizing. This RN went to pt's room immediately. PT was having active seizure (eyes, fixed, having tremors). MD Ray called to bedside. HOB raised, pt placed on NRB, sx hooked up. Pt given 2 mg of ativan. Tongue trauma visible.

## 2020-10-07 NOTE — ED Provider Notes (Signed)
[facility] EMERGENCY DEPARTMENT Provider Note   CSN: [id number] Arrival date & time: 10/07/20  0550     History Chief Complaint  Patient presents with   Fall    Capri Campanaro is a 60 y.o. male.  HPI  Patient with history of seizures and anoxic brain injury presents with unwitnessed fall.  Patient has a history of seizures, the fall was unwitnessed and it was unclear if this was due to a seizure.  Patient is complaining about pain everywhere, worse in his back.  He is not on any blood thinners.  I spoke with the patient's wife, she states she received a phone call around 5 AM that her husband was found on the floor.  He had an unwitnessed fall, likely going to use the bathroom.  Patient has a limited amount of mobility, he supposed to use a wheelchair but often forgets.  Patient does have a history of seizures, he is on medication and that has been well controlled over the last few months.  He has not had any recent changes in his baseline, he did get in a disagreement with one of his roommates which is atypical for him a few weeks ago, but other than that there has not been any recent changes in his behavior.  His wife states that he often complains of pain everywhere.  He suffers from chronic back pain.   I did speak with the on-call [facility] and she was unable to provide any information about the patient's fall because she was not present.  States that the patient is on seizure prophylactic medicine.    Level 5 caveat applies due to patient's mental status.  History is provided from chart review, the patient, patient's wife, his facility.  Past Medical History:  Diagnosis Date   Alcohol use    Anoxic encephalopathy ([place])    secondary to cardiac arrest on 01/03/16.   Anxiety    Depression, major    Diverticulosis    GERD (gastroesophageal reflux disease)    Hyperlipidemia    Hypertension    Mobility impaired    Parkinsonism ([place])    Personal  history of sudden cardiac arrest    with subsequent anoxic brain injury   Recurrent falls    Seizures ([place])    Ulcerative colitis [facility])     Patient Active Problem List   Diagnosis Date Noted   Gait abnormality 09/01/2016   Seizures ([place]) 09/01/2016   Anoxic brain damage ([place]) 09/01/2016   Anoxic encephalopathy ([place]) 04/07/2016   Dysphagia 04/07/2016   Essential hypertension 04/07/2016   Seizure disorder ([place]) 04/07/2016   Gastroesophageal reflux disease 04/07/2016   Depression 04/07/2016   History of alcohol use 04/07/2016   Ulcerative colitis ([place]) 04/07/2016   Hyperlipidemia 04/07/2016    Past Surgical History:  Procedure Laterality Date   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 Authorizing Provider  acetaminophen (TYLENOL) 500 MG tablet Take 500 mg by mouth every 8 (eight) hours as needed for mild pain or fever.     [provider]  bisacodyl (DULCOLAX) 10 MG suppository Place 10 mg rectally See admin instructions. Give 1 suppository (10 mg totally) rectally 1 dose in 24 hours as needed if not relief by Milk of Magnesium    [provider]  calcium-vitamin D (OSCAL WITH D) 500-200 MG-UNIT tablet Take 1 tablet by mouth 2 (two) times daily.    [provider]  lacosamide (VIMPAT) 200 MG TABS tablet Take 1 tablet (200 mg total) by mouth 2 (two) times daily. 06/09/16   Charlesetta Shanks, MD  levETIRAcetam (KEPPRA) 500 MG tablet Take 3-4 tablets  (1,500-2,000 mg total) by mouth 2 (two) times daily. Take 1500 in am, 2028m in pm 11/30/19   LMosetta Anis MD  Magnesium Hydroxide (MILK OF MAGNESIA PO) Take 30 mLs by mouth See admin instructions. Give 30 ml Milk of Magnesium by mouth 1 time in 24 hours as needed of no bowel movement in 3 days    [provider]  mesalamine (LIALDA) 1.2 g EC tablet Take 2.4 g by mouth daily with breakfast.     [provider]  mirtazapine (REMERON) 7.5 MG tablet Take 7.5 mg by mouth at bedtime.    [provider]  nystatin (NYSTATIN) powder Apply 1 application topically 2 (two) times daily. To groin    [provider]  sennosides-docusate sodium (SENOKOT-S) 8.6-50 MG tablet Take 2 tablets by mouth at bedtime as needed for constipation.    [provider]  sertraline (ZOLOFT) 100 MG tablet Take 100 mg by mouth daily.     [provider]  Sodium Phosphates (RA SALINE ENEMA RE) Place 1 each rectally See admin instructions. Give 1 dose rectally disposable Saline Enema in 24 hours as needed if not relief by Bisacodyl suppository    [provider]  zinc oxide (BALMEX) 11.3 % CREA cream Apply 1 application topically See admin instructions. Apply into the bottom and perineal area 6 times a days as needed for redness    [provider]    Allergies    Fluconazole  Review of Systems   Review of Systems  Unable to perform ROS: Mental status change  Constitutional:  Negative for fatigue and fever.  Eyes:  Negative for photophobia and visual disturbance.  Respiratory:  Negative for cough and shortness of breath.   Cardiovascular:  Negative for chest pain.  Gastrointestinal:  Negative for abdominal pain, nausea and vomiting.  Genitourinary:  Negative for dysuria and hematuria.  Musculoskeletal:  Positive for back pain and gait problem.  Neurological:  Positive for headaches.  Psychiatric/Behavioral:  Negative for confusion.    Physical  Exam Updated Vital Signs BP 130/90   Pulse 64   Temp 98.5 F (36.9 C) (Oral)   Resp 17   Ht 5' 10"  (1.778 m)   Wt 81.6 kg   SpO2 97%   BMI 25.83 kg/m   Physical Exam Vitals and nursing note reviewed. Exam conducted with a chaperone present.  Constitutional:      Appearance: Normal appearance.  HENT:     Head: Normocephalic.     Mouth/Throat:     Comments: Tongue is atraumatic Eyes:     General: No scleral icterus.       Right eye: No discharge.  Left eye: No discharge.     Extraocular Movements: Extraocular movements intact.     Pupils: Pupils are equal, round, and reactive to light.  Neck:     Comments: No midline tenderness, no crepitus.  Patient is tender paraspinally Cardiovascular:     Rate and Rhythm: Normal rate and regular rhythm.     Pulses: Normal pulses.     Heart sounds: Normal heart sounds. No murmur heard.   No friction rub. No gallop.  Pulmonary:     Effort: Pulmonary effort is normal. No respiratory distress.     Breath sounds: Normal breath sounds.  Abdominal:     General: Abdomen is flat. Bowel sounds are normal. There is no distension.     Palpations: Abdomen is soft.     Tenderness: There is no abdominal tenderness.     Comments: Abdomen is soft, nontender  Musculoskeletal:        General: Tenderness present. Normal range of motion.     Cervical back: Normal range of motion.     Comments: Midline tenderness to the lumbar region.  No tenderness to the hips, legs, upper extremities, abdomen.  Patient is able to raise both legs, raising the right leg elicits pain in the lower back. Right knee is painful and swollen.   Skin:    General: Skin is warm and dry.     Coloration: Skin is not jaundiced.     Comments: No decubitus ulcer.  No lacerations or places with active bleeding.  Petechial abrasion to back  Neurological:     Mental Status: He is alert. Mental status is at baseline.     Coordination: Coordination normal.     Comments: Cranial  nerves III through XII are grossly intact.  Grip strength is equal bilaterally.  Sensation to lower touch is intact to the lower and upper extremities.    ED Results / Procedures / Treatments   Labs (all labs ordered are listed, but only abnormal results are displayed) Labs Reviewed - No data to display  EKG None  Radiology No results found.  Procedures Procedures   Medications Ordered in ED Medications - No data to display  ED Course  I have reviewed the triage vital signs and the nursing notes.  Pertinent labs & imaging results that were available during my care of the patient were reviewed by me and considered in my medical decision making (see chart for details).    MDM Rules/Calculators/A&P                           Went to bedside because patient had an active seizure.  Seizure improved with Ativan, patient given 2 g of Keppra.  We will continue to observe.  Patient is on Vimpat and Keppra prophylactically, no missed doses. Suspect fall was incited by seizure.   Plan: Patient will need admission.   Discussed HPI, physical exam and plan of care for this patient with attending D. Ray. The attending physician evaluated this patient as part of a shared visit and agrees with plan of care.   Final Clinical Impression(s) / ED Diagnoses Final diagnoses:  None    Rx / DC Orders ED Discharge Orders     None        Sherrill Raring, [place] 10/07/20 1006    Pattricia Boss, MD 10/07/20 1338

## 2020-10-07 NOTE — ED Triage Notes (Signed)
Pt bib [facility] from [facility] after staff called report pt having unwitnessed fall. Per EMS pt found in bed upon arrival. Pt c/o back pain, denies LOC, denies head or neck injury. No Hx of thinners.   Hx: cardiac arrest

## 2020-10-07 NOTE — ED Notes (Signed)
Pt 02 sat 91. Pt placed on 4 L [place], pt  02 sat now 97.

## 2020-10-07 NOTE — ED Notes (Signed)
Patient transported to CT 

## 2020-10-07 NOTE — H&P (Addendum)
Date: 10/07/2020               Patient Name:  Alexander Henderson MRN: [id number]  DOB: February 14, 1960 Age / Sex: 60 y.o., male   PCP: Marcial Pacas, MD         Medical Service: Internal Medicine Teaching Service         Attending Physician: Dr. Evette Doffing, Mallie Mussel, *    First Contact: Merrily Brittle Pager: [phone]  Second Contact: Hadassah Pais, MD Pager: Rudean Curt [phone]       After Hours (After 5p/  First Contact Pager: [phone]  weekends / holidays): Second Contact Pager: [phone]   SUBJECTIVE  Chief Complaint: Seizure  History of Present Illness: Alexander Henderson is a 60 y.o. male with a pertinent PMH of seizure disorder, hypertension, GERD, ulcerative colitis, hyperlipidemia, anoxic brain injury, who presents to [facility] after a fall with seizure like activity after being found down at [place].The patient's wife is at bedside and provides majority of the patient's history as the patient is sedated in the setting of his recent seizure.   Patient has a history of seizures complicated by anoxic brain injury.  He was previously being treated care for at home [facility] but has since been moved to [facility] for additional care.  Patient is seen by [facility] neurology in the outpatient setting for seizure disorder.  He has been on consistent dose of Keppra and Vimpat since his last on 11/29/2019.  No clear etiology was identified at that time and Keppra was increased to 2000 mg.  There was some concern the patient had a breakthrough seizure secondary to missed doses of Keppra.  Patient's wife states that he has not had any obvious signs of infection such as fever, chills, cough, dysuria, wounds, abdominal pain, melena/hematochezia or myalgias prior to his admission.  She states that he does have a history of decreased appetite that has improved since being on mirtazapine.  Does state that over the past 9 months she has noticed that his memory has worsened and appears to have some cognitive decline.   She states that his baseline function is communicative with confusion and waxing and waning orientation.   Medications: No current facility-administered medications on file prior to encounter.   Current Outpatient Medications on File Prior to Encounter  Medication Sig Dispense Refill   acetaminophen (TYLENOL) 500 MG tablet Take 500 mg by mouth every 8 (eight) hours as needed for mild pain or fever.      bisacodyl (DULCOLAX) 10 MG suppository Place 10 mg rectally See admin instructions. Give 1 suppository (10 mg totally) rectally 1 dose in 24 hours as needed if not relief by Milk of Magnesium     calcium-vitamin D (OSCAL WITH D) 500-200 MG-UNIT tablet Take 1 tablet by mouth 2 (two) times daily.     lacosamide (VIMPAT) 200 MG TABS tablet Take 1 tablet (200 mg total) by mouth 2 (two) times daily. 60 tablet 1   levETIRAcetam (KEPPRA) 1000 MG tablet Take 2,000 mg by mouth every evening.     levETIRAcetam (KEPPRA) 750 MG tablet Take 1,500 mg by mouth in the morning.     Magnesium Hydroxide (MILK OF MAGNESIA PO) Take 30 mLs by mouth See admin instructions. Give 30 ml Milk of Magnesium by mouth 1 time in 24 hours as needed of no bowel movement in 3 days     melatonin 3 MG TABS tablet Take 3 mg by mouth at bedtime.  mesalamine (LIALDA) 1.2 g EC tablet Take 2.4 g by mouth daily with breakfast.      nystatin (MYCOSTATIN/NYSTOP) powder Apply 1 application topically 3 (three) times daily as needed (skin folds/rash). To groin     sennosides-docusate sodium (SENOKOT-S) 8.6-50 MG tablet Take 2 tablets by mouth at bedtime as needed for constipation.     sertraline (ZOLOFT) 100 MG tablet Take 100 mg by mouth daily.      Sodium Phosphates (RA SALINE ENEMA RE) Place 1 each rectally See admin instructions. Give 1 dose rectally disposable Saline Enema in 24 hours as needed if not relief by Bisacodyl suppository     zinc oxide (BALMEX) 11.3 % CREA cream Apply 1 application topically See admin instructions. Apply  into the bottom and perineal area 6 times a days as needed for redness     levETIRAcetam (KEPPRA) 500 MG tablet Take 3-4 tablets (1,500-2,000 mg total) by mouth 2 (two) times daily. 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   OBJECTIVE:  Physical Exam: Blood pressure 115/84, pulse 84, temperature 98.5 F (36.9 C), temperature source Oral, resp. rate 12, height 5' 10"  (1.778 m), weight 81.6 kg, SpO2 99 %. Physical Exam Constitutional:      Appearance: He is ill-appearing.  HENT:     Head: Normocephalic and atraumatic.     Mouth/Throat:     Mouth: Mucous membranes are moist.     Comments: Laceration with surrounding blood Cardiovascular:     Rate and Rhythm: Normal rate.     Pulses: Normal pulses.     Heart sounds: Normal heart sounds.  Pulmonary:     Effort: Pulmonary effort is normal.     Breath sounds: Normal breath sounds.  Abdominal:     General: Abdomen is flat. Bowel sounds are normal. There is no distension.     Tenderness: There is no abdominal  tenderness.  Musculoskeletal:        General: No swelling or tenderness.  Skin:    General: Skin is warm and dry.     Findings: No erythema, lesion or rash.  Neurological:     Comments: sedated    Pertinent Labs: CBC    Component Value Date/Time   WBC 10.4 10/07/2020 1159   RBC 4.72 10/07/2020 1159   HGB 14.7 10/07/2020 1159   HCT 43.2 10/07/2020 1159   PLT 235 10/07/2020 1159   MCV 91.5 10/07/2020 1159   MCH 31.1 10/07/2020 1159  MCHC 34.0 10/07/2020 1159   RDW 12.3 10/07/2020 1159   LYMPHSABS 3.6 10/07/2020 0741   MONOABS 0.8 10/07/2020 0741   EOSABS 0.3 10/07/2020 0741   BASOSABS 0.0 10/07/2020 0741     CMP     Component Value Date/Time   NA 138 10/07/2020 0741   NA 142 04/27/2016 0000   K 4.1 10/07/2020 0741   CL 105 10/07/2020 0741   CO2 25 10/07/2020 0741   GLUCOSE 93 10/07/2020 0741   BUN 13 10/07/2020 0741   BUN 11 04/27/2016 0000   CREATININE 0.78 10/07/2020 1159   CALCIUM 9.1 10/07/2020 0741   PROT 5.8 (L) 11/29/2019 1548   ALBUMIN 3.2 (L) 11/29/2019 1548   AST 16 11/29/2019 1548   ALT 14 11/29/2019 1548   ALKPHOS 72 11/29/2019 1548   BILITOT 0.5 11/29/2019 1548   GFRNONAA >60 10/07/2020 1159   GFRAA >60 11/17/2017 1225    Pertinent Imaging: CT Head Wo Contrast  Result Date: 10/07/2020 CLINICAL DATA:  Head and neck trauma.  Fall.  Found obtunded. EXAM: CT HEAD WITHOUT CONTRAST TECHNIQUE: Contiguous axial images were obtained from the base of the skull through the vertex without intravenous contrast. COMPARISON:  11/29/2019 FINDINGS: Brain: Mild generalized volume loss. No evidence of old or acute focal infarction, mass lesion, hemorrhage, hydrocephalus or extra-axial collection. Vascular: No abnormal vascular finding. Skull: No skull fracture. Sinuses/Orbits: Clear/normal Other: None IMPRESSION: No acute or traumatic finding.  Mild generalized atrophy. Electronically Signed   By: Nelson Chimes M.D.   On: 10/07/2020 07:38   CT Cervical Spine Wo  Contrast  Result Date: 10/07/2020 CLINICAL DATA:  Head and neck trauma.  Intoxicated.  Found obtunded. EXAM: CT CERVICAL SPINE WITHOUT CONTRAST TECHNIQUE: Multidetector CT imaging of the cervical spine was performed without intravenous contrast. Multiplanar CT image reconstructions were also generated. COMPARISON:  11/17/2017 FINDINGS: Alignment: No traumatic malalignment. Skull base and vertebrae: No regional fracture or focal bone lesion. Soft tissues and spinal canal: No significant soft tissue finding. Disc levels: Ordinary mid cervical spondylosis with uncovertebral osteophytes at C5-6 and C6-7. Mild scratch set bony foraminal narrowing at those levels. No apparent canal stenosis. No evidence of soft disc pathology by CT. Upper chest: Some emphysematous changes.  No acute finding. Other: None IMPRESSION: No acute or traumatic finding. Ordinary mid cervical spondylosis with bony foraminal narrowing at C5-6 and C6-7. Electronically Signed   By: Nelson Chimes M.D.   On: 10/07/2020 07:39   CT Lumbar Spine Wo Contrast  Result Date: 10/07/2020 CLINICAL DATA:  Low back pain.  Fall.  Trauma. EXAM: CT LUMBAR SPINE WITHOUT CONTRAST TECHNIQUE: Multidetector CT imaging of the lumbar spine was performed without intravenous contrast administration. Multiplanar CT image reconstructions were also generated. COMPARISON:  None FINDINGS: Segmentation: 5 lumbar type vertebral bodies. Alignment: Mild scoliotic curvature. Vertebrae: No fracture or acute traumatic finding. Paraspinal and other soft tissues: Aortic atherosclerosis. Otherwise negative. Disc levels: No significant disc level pathology at T12-L1. L1-2: Chronic disc space narrowing with bridging osteophytes. No canal or foraminal stenosis. L2-3: Minimal disc bulge.  No stenosis. L3-4: Chronic disc degeneration with loss of disc height, endplate osteophytes and sclerotic endplate changes more pronounced on the right. Mild narrowing of the right lateral recess and  intervertebral foramen but no apparent neural compression. L4-5: Mild disc bulge. Mild facet degeneration and hypertrophy. No compressive stenosis. L5-S1: Minimal disc bulge.  Mild facet degeneration.  No stenosis. IMPRESSION: No acute or traumatic finding. Mild scoliosis. Mild-to-moderate degenerative changes which could  relate to back pain. No apparent neural compressive stenosis. Electronically Signed   By: Nelson Chimes M.D.   On: 10/07/2020 07:36   DG Knee Complete 4 Views Right  Result Date: 10/07/2020 CLINICAL DATA:  Knee pain EXAM: RIGHT KNEE - COMPLETE 4+ VIEW COMPARISON:  No comparison studies available. FINDINGS: No fracture. No subluxation or dislocation. Degenerative spurring noted patellofemoral compartment with possible chronic osteochondral defect along the articular surface of the patella. No substantial joint effusion. IMPRESSION: 1. No acute findings. 2. Degenerative changes with possible chronic osteochondral defect in the patella. MRI of the knee may prove helpful to further evaluate. Electronically Signed   By: Misty Stanley M.D.   On: 10/07/2020 08:38    EKG: personally reviewed my interpretation is normal EKG, normal sinus rhythm  ASSESSMENT & PLAN:  Assessment: Active Problems:   Seizure disorder (HCC)   Ulcerative colitis (HCC)   Anoxic brain damage ([place])   Seizure (HCC)   Eldo Deemer is a 60 y.o. with pertinent PMH of seizure disorder, hypertension, GERD, ulcerative colitis, hyperlipidemia, anoxic brain injury who presented with seizure-like activity and admit for seizure on hospital day 0  Plan: #Seizure disorder #Anoxic brain injury Patient has a history of seizure disorder complicated by anoxic brain injury.  Patient needs full assistance with ADLs at [facility] [place].  Patient was found after having a fall this morning with subsequent seizure here in the emergency room. Patient does not have any evidence of infection or metabolic disturbance.  He does however have a  phosphorus of 2.0 which is unlikely to be contributing to his underlying seizures.  There are no recent changes to his medications that precipitated a new seizure.  Additionally noncontrast CT does not show any evidence of acute hemorrhagic stroke or intracranial mass.  Neurology was consulted in the ED and loaded him on Keppra -Continue Keppra and Vimpat per neurology - Consider EEG if he has another seizure -Consider MRI -CK pending -N.p.o. until SLP evaluation   #Ulcerative colitis The patient's wife states that she is unaware of abdominal pain, melena or hematochezia. She states that he has had well controlled  UC  on mesalamine 2.4 mg p.o. daily with breakfast -We will continue this during his inpatient stay.   Best Practice: Diet: NPO IVF: NaPhos VTE: enoxaparin (LOVENOX) injection 40 mg Start: 10/07/20 1600   Code: DNR AB: None Status: Inpatient with expected length of stay greater than 2 midnights. Anticipated Discharge Location: SNF Barriers to Discharge: Medical stability  Signature: Lawerance Cruel, D.O.  Internal Medicine Resident, PGY-3 [facility]  Pager: [phone] 2:35 PM, 10/07/2020   Please contact the on call pager after 5 pm and on weekends at [phone].

## 2020-10-07 NOTE — Consult Note (Addendum)
Neurology Consult H&P  Alin Tanzi MR# [id number] 10/07/2020  CC: seizure like activity.   History is obtained from: chart,   HPI: HPI: Alexander Henderson is a 60 y.o. male PMHx as reviewed below anoxic brain injury secondary to cardiac arrest with subsequent seizures evaluated for breakthrough seizures on LEV 1564m AM/20051mPM and LCM 20038mwo times daily. He was found in bed upon arrival and though to be seizure or fall brought to ED for further evaluation. While in ED, he had witnessed seizure and loaded with LEV and lorazepam.  ROS: The patient was encephalopathic, history and exam were limited.   Past Medical History:  Diagnosis Date   Alcohol use    [place]  secondary to cardiac arrest on 01/03/16.   Anxiety    Depression, major    Diverticulosis    GERD (gastroesophageal reflux disease)    Hyperlipidemia    Hypertension    Mobility impaired    Parkinsonism (HCC)    Personal history of sudden cardiac arrest    with subsequent anoxic brain injury   Recurrent falls    [place]iz[place] ([place]  [place]cerative colitis ([place]   Family History  Adopted: Yes   Social History:   reports that he has never smoked. He has never used smokeless tobacco. He reports that he does not drink alcohol and does not use drugs.  Medications No current facility-administered medications for this encounter.  Current Outpatient Medications:    acetaminophen (TYLENOL) 500 MG tablet, Take 500 mg by mouth every 8 (eight) hours as needed for mild pain or fever. , Disp: , Rfl:    bisacodyl (DULCOLAX) 10 MG suppository, Place 10 mg rectally See admin instructions. Give 1 suppository (10 mg totally) rectally 1 dose in 24 hours as needed if not relief by Milk of Magnesium, Disp: , Rfl:    calcium-vitamin D (OSCAL WITH D) 500-200 MG-UNIT tablet, Take 1 tablet by mouth 2 (two) times daily., Disp: , Rfl:    lacosamide (VIMPAT) 200 MG TABS tablet, Take 1 tablet (200 mg total) by mouth 2  (two) times daily., Disp: 60 tablet, Rfl: 1   levETIRAcetam (KEPPRA) 500 MG tablet, Take 3-4 tablets (1,500-2,000 mg total) by mouth 2 (two) times daily. Take 1500 in am, 2000m75m pm, Disp: , Rfl:    Magnesium Hydroxide (MILK OF MAGNESIA PO), Take 30 mLs by mouth See admin instructions. Give 30 ml Milk of Magnesium by mouth 1 time in 24 hours as needed of no bowel movement in 3 days, Disp: , Rfl:    mesalamine (LIALDA) 1.2 g EC tablet, Take 2.4 g by mouth daily with breakfast. , Disp: , Rfl:    mirtazapine (REMERON) 7.5 MG tablet, Take 7.5 mg by mouth at bedtime., Disp: , Rfl:    nystatin (NYSTATIN) powder, Apply 1 application topically 2 (two) times daily. To groin, Disp: , Rfl:    sennosides-docusate sodium (SENOKOT-S) 8.6-50 MG tablet, Take 2 tablets by mouth at bedtime as needed for constipation., Disp: , Rfl:    sertraline (ZOLOFT) 100 MG tablet, Take 100 mg by mouth daily. , Disp: , Rfl:    Sodium Phosphates (RA SALINE ENEMA RE), Place 1 each rectally See admin instructions. Give 1 dose rectally disposable Saline Enema in 24 hours as needed if not relief by Bisacodyl suppository, Disp: , Rfl:    zinc oxide (BALMEX) 11.3 % CREA cream, Apply 1 application topically See admin instructions. Apply into the bottom and  perineal area 6 times a days as needed for redness, Disp: , Rfl:    Exam: Current vital signs: BP 112/85   Pulse 88   Temp 98.5 F (36.9 C) (Oral)   Resp 14   Ht 5' 10"  (1.778 m)   Wt 81.6 kg   SpO2 97%   BMI 25.83 kg/m  Vital signs in last 24 hours: Temp:  [98.5 F (36.9 C)] 98.5 F (36.9 C) (09/13 0600) Pulse Rate:  [61-105] 88 (09/13 1000) Resp:  [14-21] 14 (09/13 1000) BP: (112-138)/(82-91) 112/85 (09/13 1000) SpO2:  [94 %-99 %] 97 % (09/13 1000) Weight:  [81.6 kg] 81.6 kg (09/13 0601)  Physical Exam  Constitutional: Appears well-developed and well-nourished.  Psych: Affect appropriate to situation Eyes: No scleral injection HENT: No OP obstruction. Head:  Normocephalic.  Cardiovascular: Normal rate and regular rhythm.  Respiratory: Effort normal, symmetric excursions bilaterally, no audible wheezing. GI: Soft.  No distension. There is no tenderness.  Skin: WDI  Neuro: Mental Status: Patient is somnolent and  Patient is arouses to vigorous voice Patient is not able to give a clear and coherent history due to encephalopathy. Speech fluent with bradylalia, comprehension seems intact somnolence prevented repetition. No overt signs of aphasia or neglect. Visual Fields are full. Pupils are equal, round, and reactive to light. EOMI without ptosis or diploplia.  Facial sensation is symmetric to temperature Facial movement is symmetric.  Hearing is intact to voice. Uvula midline and palate elevates symmetrically. Shoulder shrug is symmetric. Tongue is midline without atrophy or fasciculations.  Tone is increased. Bulk is normal. Moves extremities inconsistently. Sensation is symmetric to light touch. Deep Tendon Reflexes: difficult to evaluate. Toes are downgoing bilaterally. FNF and HKS are intact bilaterally. Gait - Deferred  I have reviewed labs in epic and the pertinent results are:  CBC    Component Value Date/Time   WBC 11.7 (H) 10/07/2020 0741   RBC 4.92 10/07/2020 0741   HGB 15.4 10/07/2020 0741   HCT 45.5 10/07/2020 0741   PLT 244 10/07/2020 0741   MCV 92.5 10/07/2020 0741   MCH 31.3 10/07/2020 0741   MCHC 33.8 10/07/2020 0741   RDW 12.2 10/07/2020 0741   LYMPHSABS 3.6 10/07/2020 0741   MONOABS 0.8 10/07/2020 0741   EOSABS 0.3 10/07/2020 0741   BASOSABS 0.0 10/07/2020 0741   CMP     Component Value Date/Time   NA 138 10/07/2020 0741   NA 142 04/27/2016 0000   K 4.1 10/07/2020 0741   CL 105 10/07/2020 0741   CO2 25 10/07/2020 0741   GLUCOSE 93 10/07/2020 0741   BUN 13 10/07/2020 0741   BUN 11 04/27/2016 0000   CREATININE 0.72 10/07/2020 0741   CALCIUM 9.1 10/07/2020 0741   PROT 5.8 (L) 11/29/2019 1548    ALBUMIN 3.2 (L) 11/29/2019 1548   AST 16 11/29/2019 1548   ALT 14 11/29/2019 1548   ALKPHOS 72 11/29/2019 1548   BILITOT 0.5 11/29/2019 1548   GFRNONAA >60 10/07/2020 0741   GFRAA >60 11/17/2017 1225   I have reviewed the images obtained: NCT head showed no acute ischemic changes, hemorrhage, mass.  EEG 07/27/2016: showed frequent independent left and right hemisphere spikes and sharp waves.   Assessment: 60 year old man with history of EtOH abuse and anoxic brain injury and subsequent seizure with breakthrough seizure. Chart review showed patient has history of medication non adherence and previous hospitalization 2021 LEV was increased to 1551m am and 20063mpm and LCM continued at  279m bid. Exam was not suggestive of status epilepticus and will not pursue EEG at this time.  We will attempt to gather more information regarding medication adherence versus malabsorption  and if he has not missed any doses will consider third agent. In addition, the patient has ulcerative colitis and it is not clear if it is active/ bleeding and diarrhea which may cause malabsorption.  Impression: Lacosamide level - Ordered. Recommendations: - Continue levetiracetam 15092mam and 200035mm. - Continue lacosamide 200m34mo times daily for now. - Based on further information from family will decide if medications should be adjusted and will consider a once daily medication such as zonisamide. - Consider metabolic/infectious workup if he is complaint - CMP, UA with UCx, CXR, CK, serum lactate. - Seizure precautions  HuntLynnae [id number]

## 2020-10-07 NOTE — ED Provider Notes (Signed)
60 year old male history of TBI, presents today after being found on the ground at his facility.  He does have a history of seizures.  On his evaluation here there is no stigmata of seizure.  However he does have some discoloration skin on the right low back with some tenderness to palpation.  Has some pain and swelling in his right knee.  Otherwise no obvious injury noted.   Vitals signs are stable WDWN male nad HEENT normal Chest wall normal Abdomen- soft nttp Back - contusion some petechia right low back Extremities - full arom mild swelling and ttp right knee Pulses intact CT and radiographs pending. 8:40 AM Called to bedside due to seizure Patient with eyes to right and generalized tonicity Discussed care with IM teaching service for admission and neurohospitalist for consult.     Pattricia Boss, MD 10/07/20 1056

## 2020-10-08 ENCOUNTER — Other Ambulatory Visit: Payer: Self-pay

## 2020-10-08 DIAGNOSIS — R569 Unspecified convulsions: Secondary | ICD-10-CM

## 2020-10-08 LAB — COMPREHENSIVE METABOLIC PANEL
ALT: 13 U/L (ref 0–44)
AST: 14 U/L — ABNORMAL LOW (ref 15–41)
Albumin: 3.2 g/dL — ABNORMAL LOW (ref 3.5–5.0)
Alkaline Phosphatase: 58 U/L (ref 38–126)
Anion gap: 7 (ref 5–15)
BUN: 8 mg/dL (ref 6–20)
CO2: 26 mmol/L (ref 22–32)
Calcium: 8.4 mg/dL — ABNORMAL LOW (ref 8.9–10.3)
Chloride: 104 mmol/L (ref 98–111)
Creatinine, Ser: 0.7 mg/dL (ref 0.61–1.24)
GFR, Estimated: >60 mL/min (ref >60)
Glucose, Bld: 87 mg/dL (ref 70–99)
Potassium: 3.3 mmol/L — ABNORMAL LOW (ref 3.5–5.1)
Sodium: 137 mmol/L (ref 135–145)
Total Bilirubin: 0.9 mg/dL (ref 0.3–1.2)
Total Protein: 5.8 g/dL — ABNORMAL LOW (ref 6.5–8.1)

## 2020-10-08 LAB — CBC
HCT: 43.2 % (ref 39.0–52.0)
Hemoglobin: 14.8 g/dL (ref 13.0–17.0)
MCH: 31.6 pg (ref 26.0–34.0)
MCHC: 34.3 g/dL (ref 30.0–36.0)
MCV: 92.3 fL (ref 80.0–100.0)
Platelets: 224 10*3/uL (ref 150–400)
RBC: 4.68 MIL/uL (ref 4.22–5.81)
RDW: 12.5 % (ref 11.5–15.5)
WBC: 10.5 10*3/uL (ref 4.0–10.5)
nRBC: 0 % (ref 0.0–0.2)

## 2020-10-08 LAB — LACTIC ACID, PLASMA: Lactic Acid, Venous: 0.9 mmol/L (ref 0.5–1.9)

## 2020-10-08 LAB — PHOSPHORUS: Phosphorus: 4.2 mg/dL (ref 2.5–4.6)

## 2020-10-08 LAB — URINALYSIS, ROUTINE W REFLEX MICROSCOPIC
Bilirubin Urine: NEGATIVE
Glucose, UA: NEGATIVE mg/dL
Hgb urine dipstick: NEGATIVE
Ketones, ur: NEGATIVE mg/dL
Leukocytes,Ua: NEGATIVE
Nitrite: NEGATIVE
Protein, ur: NEGATIVE mg/dL
Specific Gravity, Urine: >1.03 — ABNORMAL HIGH (ref 1.005–1.030)
pH: 6 (ref 5.0–8.0)

## 2020-10-08 LAB — MAGNESIUM: Magnesium: 2 mg/dL (ref 1.7–2.4)

## 2020-10-08 LAB — CBG MONITORING, ED: Glucose-Capillary: 98 mg/dL (ref 70–99)

## 2020-10-08 MED ORDER — MESALAMINE 1.2 G PO TBEC
2.4000 g | DELAYED_RELEASE_TABLET | Freq: Every day | ORAL | Status: DC
  Administered 2020-10-09 – 2020-10-10 (×2): 2.4 g via ORAL
  Filled 2020-10-08 (×2): qty 2

## 2020-10-08 MED ORDER — MELATONIN 3 MG PO TABS
3.0000 mg | ORAL_TABLET | Freq: Every day | ORAL | Status: DC
  Administered 2020-10-08 – 2020-10-09 (×2): 3 mg via ORAL
  Filled 2020-10-08 (×2): qty 1

## 2020-10-08 MED ORDER — LACOSAMIDE 200 MG PO TABS
200.0000 mg | ORAL_TABLET | Freq: Two times a day (BID) | ORAL | Status: DC
  Administered 2020-10-08 – 2020-10-10 (×5): 200 mg via ORAL
  Filled 2020-10-08 (×5): qty 1

## 2020-10-08 MED ORDER — LEVETIRACETAM 750 MG PO TABS: 2000.0000 mg | ORAL_TABLET | Freq: Every day | ORAL | Status: DC

## 2020-10-08 MED ORDER — SERTRALINE HCL 100 MG PO TABS
100.0000 mg | ORAL_TABLET | Freq: Every day | ORAL | Status: DC
  Administered 2020-10-08 – 2020-10-10 (×3): 100 mg via ORAL
  Filled 2020-10-08 (×3): qty 1

## 2020-10-08 MED ORDER — LEVETIRACETAM 750 MG PO TABS: 1500.0000 mg | ORAL_TABLET | Freq: Every day | ORAL | Status: DC

## 2020-10-08 MED ORDER — LEVETIRACETAM 750 MG PO TABS
2000.0000 mg | ORAL_TABLET | Freq: Two times a day (BID) | ORAL | Status: DC
  Administered 2020-10-08 – 2020-10-10 (×5): 2000 mg via ORAL
  Filled 2020-10-08: qty 2
  Filled 2020-10-08 (×4): qty 1
  Filled 2020-10-08: qty 2

## 2020-10-08 MED ORDER — POTASSIUM CHLORIDE 10 MEQ/100ML IV SOLN
10.0000 meq | INTRAVENOUS | Status: AC
  Administered 2020-10-08 (×6): 10 meq via INTRAVENOUS
  Filled 2020-10-08 (×4): qty 100

## 2020-10-08 NOTE — Progress Notes (Signed)
Neurology Progress Note  Brief HPI: Alexander Henderson is a 60 y.o. 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 He is on LEV 1533m AM/20015mPM and LCM 20060mwo times daily.   9/14: Was able to obtain further information from patient's wife via telephone. She states that the patient was found on the floor at his living facility complaining of neck and back pain (chronic pain per patient's wife). She states that he does sometimes have falls due to forgetting his ambulation limitations but while in the ED, she witnessed him have a GTC seizure. She states that his seizures are normally very well controlled on his home regimen and that when he does have a seizure, they are typically able to pinpoint a specific reason such as a missed medication dose or medication change. His last seizure was approximately one year ago, however, they never identified a specific reason for his seizure at this time. He does have a history of UC that is reported to be exceptionally well controlled without evidence of active flare at this time. Currently, there are no reported missed doses of his seizure medication at his facility or recent dose changes to explain breakthrough seizure.   At baseline, Alexander Henderson not oriented to space or time. His wife notes that there are times that he does not remember that he is marries. He is able to communicate his needs and can mostly feed himself but he is incontinent of bowel and bladder. She reports that he has recently lost some dexterity in his hands recently and has developed a more flat affect than usual. He sometimes forgets his limitations and does sometimes fall when he forgets his limitations.   Subjective: No acute overnight events    Exam: Vitals:   10/08/20 0600 10/08/20 0859  BP: 103/76 90/65  Pulse: 65 64  Resp: 14 (!) 24  Temp: 98.3 F (36.8 C) 98.4 F (36.9 C)  SpO2:  100%   Gen: Laying comfortably in bed, in no acute distress Resp: non-labored breathing, no respiratory distress Abd: soft, non-tender, non-distended  Neuro: Mental Status: Awake, alert but not oriented (per baseline). He is unable to provide a clear or coherent history of present illness. He is unable to provide examiner with any details regarding his presentation or baseline mental or physical status.   Speech is without dysarthria. Bradyphrenia noted with poor attention. No signs of aphasia or neglect noted.  Cranial Nerves: EOMI, hearing is intact to voice, face is symmetric resting and with movement, head is grossly midline, phonation intact.  Motor: Moves all extremities spontaneously with antigravity movement, bradykinetic movements without noted asymmetry. Sensory: Intact and symmetric to light touch throughout. Gait: Deferred  Pertinent Labs: CBC    Component Value Date/Time   WBC 10.5 10/08/2020 0303   RBC 4.68 10/08/2020 0303   HGB 14.8 10/08/2020 0303   HCT 43.2 10/08/2020 0303   PLT 224 10/08/2020 0303   MCV 92.3 10/08/2020 0303   MCH 31.6 10/08/2020 0303   MCHC 34.3 10/08/2020 0303   RDW 12.5 10/08/2020 0303   LYMPHSABS 3.6 10/07/2020 0741   MONOABS 0.8 10/07/2020 0741   EOSABS 0.3 10/07/2020 0741   BASOSABS 0.0 10/07/2020 0741   CMP  Component Value Date/Time   NA 137 10/08/2020 0303   NA 142 04/27/2016 0000   K 3.3 (L) 10/08/2020 0303   CL 104 10/08/2020 0303   CO2 26 10/08/2020 0303   GLUCOSE 87 10/08/2020 0303   BUN 8 10/08/2020 0303   BUN 11 04/27/2016 0000   CREATININE 0.70 10/08/2020 0303   CALCIUM 8.4 (L) 10/08/2020 0303   PROT 5.8 (L) 10/08/2020 0303   ALBUMIN 3.2 (L) 10/08/2020 0303   AST 14 (L) 10/08/2020 0303   ALT 13 10/08/2020 0303   ALKPHOS 58 10/08/2020 0303   BILITOT 0.9  10/08/2020 0303   GFRNONAA >60 10/08/2020 0303   GFRAA >60 11/17/2017 1225   Urinalysis    Component Value Date/Time   COLORURINE YELLOW 10/08/2020 0300   APPEARANCEUR CLEAR 10/08/2020 0300   LABSPEC >1.030 (H) 10/08/2020 0300   PHURINE 6.0 10/08/2020 0300   GLUCOSEU NEGATIVE 10/08/2020 0300   HGBUR NEGATIVE 10/08/2020 0300   BILIRUBINUR NEGATIVE 10/08/2020 0300   KETONESUR NEGATIVE 10/08/2020 0300   PROTEINUR NEGATIVE 10/08/2020 0300   NITRITE NEGATIVE 10/08/2020 0300   LEUKOCYTESUR NEGATIVE 10/08/2020 0300   Drugs of Abuse     Component Value Date/Time   LABOPIA NONE DETECTED 11/29/2019 1510   COCAINSCRNUR NONE DETECTED 11/29/2019 1510   LABBENZ NONE DETECTED 11/29/2019 1510   AMPHETMU NONE DETECTED 11/29/2019 1510   THCU NONE DETECTED 11/29/2019 1510   LABBARB NONE DETECTED 11/29/2019 1510    Alcohol Level No results found for: [facility]  Imaging Reviewed:  CT Head wo contrast 10/08/2020: No acute or traumatic finding.  Mild generalized atrophy.  EEG 07/27/2016: showed frequent independent left and right hemisphere spikes and sharp waves.   Assessment: 60 year old man with history of EtOH abuse and anoxic brain injury and subsequent seizures with breakthrough seizure in the ED s/p Ativan and Keppra load. Chart review showed patient has history of medication non adherence and previous hospitalization 2021 LEV was increased to 154m am and 20092mpm and LCM continued at 20079mid.  - There is no evidence of active UC flare supporting medication malabsorption and no reported missing doses of his medications. His facility manages his medications, therefore, do not believe that changing to zonisamide will improve adherence to medications.  -Work up reveals patient without fever or leukocytosis, UA is unremarkable. Can consider chest x-ray for evaluation of possible infectious etiology of breakthrough seizure, though, feel that this is unlikely without further evidence of infectious  process.  -CTH is without acute finding.  -Vimpat level is pending, unable to obtain Keppra level prior to Keppra loading dose in the ED. In the setting of breakthrough seizure without identifiable etiology, will increase home Keppra to max dosing of 2,000 mg BID, eGFR is > 60.  -Exam is not suggestive of status epilepticus and will not pursue EEG at this time.   Impression:  History of anoxic brain injury with subsequent seizure disorder Breakthrough seizure  Recommendations: - Lacosamide level pending - Increase Keppra to max dosing of 2000 mg BID - Continue lacosamide 200m40mo times daily for now, pending serum lacosamide level - Continue inpatient seizure precautions - 2 mg IV Versed PRN and seizure lasting greater than 5 minutes and notify neurology  StevAnibal HendersonACNP-BC [facility] [phone] seen, examined, labs,vitals and notes reviewed. Discussed plan with TobeEbbie Latus and agree with assessment and plan as documented above. I have independently reviewed the chart, obtained history, review of systems and examined the patient.  Electronically  signed by:  Lynnae Sandhoff, MD Page: [phone] 10/08/2020, 7:49 PM

## 2020-10-08 NOTE — TOC Initial Note (Addendum)
Transition of Care [facility]) - Initial/Assessment Note    Patient Details  Name: Alexander Henderson MRN: [id number] Date of Birth: [birth year]  Transition [facility]) CM/SW Contact:    Pollie Friar, RN Phone Number: 10/08/2020, 12:10 PM  Clinical Narrative:                 CM spoke to patients [facility] SW: [phone] and the plan, when pt is medically ready, is for him to return to [place]. CM has sent message to [facility] to confirm they will hold his bed.  TOC following.  Expected Discharge Plan: Skilled Nursing Facility Barriers to Discharge: Continued Medical Work up   Patient Goals and CMS Choice        Expected Discharge Plan and Services Expected Discharge Plan: [place] In-house Referral: Clinical Social Work Discharge Planning Services: CM Consult   Living arrangements for the past 2 months: [place]                                      Prior Living Arrangements/Services Living arrangements for the past 2 months: [place]   Patient language and need for interpreter reviewed:: Yes Do you feel safe going back to the place where you live?: Yes      Need for Family Participation in Patient Care: Yes (Comment) Care giver support system in place?: Yes (comment)   Criminal Activity/Legal Involvement Pertinent to Current Situation/Hospitalization: No - Comment as needed  Activities of Daily Living Home Assistive Devices/Equipment: Wheelchair, Blood pressure cuff ADL Screening (condition at time of admission) Patient's cognitive ability adequate to safely complete daily activities?: No Is the patient deaf or have difficulty hearing?: No Does the patient have difficulty seeing, even when wearing glasses/contacts?: No Does the patient have difficulty concentrating, remembering, or making decisions?: Yes Patient able to express need for assistance with ADLs?: Yes Does the patient have difficulty dressing or bathing?:  Yes Independently performs ADLs?: No Communication: Independent Dressing (OT): Needs assistance Is this a change from baseline?: Pre-admission baseline Grooming: Needs assistance Is this a change from baseline?: Pre-admission baseline Feeding: Needs assistance Is this a change from baseline?: Pre-admission baseline Bathing: Needs assistance Is this a change from baseline?: Pre-admission baseline Toileting: Needs assistance Is this a change from baseline?: Pre-admission baseline In/Out Bed: Needs assistance Is this a change from baseline?: Pre-admission baseline Walks in Home: Dependent (wheelchair) Is this a change from baseline?: Pre-admission baseline Does the patient have difficulty walking or climbing stairs?: Yes Weakness of Legs: Both Weakness of Arms/Hands: Both  Permission Sought/Granted                  Emotional Assessment Appearance:: Appears stated age         Psych Involvement: No (comment)  Admission diagnosis:  Seizure ([place]) [R56.9] Fall, initial encounter [W19.XXXA] Patient Active Problem List   Diagnosis Date Noted   Seizure ([place]) 10/07/2020   Gait abnormality 09/01/2016   Seizures ([place]) 09/01/2016   Anoxic brain damage ([place]) 09/01/2016   Essential hypertension 04/07/2016   Gastroesophageal reflux disease 04/07/2016   Depression 04/07/2016   Ulcerative colitis ([place]) 04/07/2016   Hyperlipidemia 04/07/2016   PCP:  Marcial Pacas, MD Pharmacy:  No Pharmacies Listed    Social Determinants of Health (SDOH) Interventions    Readmission Risk Interventions No flowsheet data found.

## 2020-10-08 NOTE — Progress Notes (Signed)
Subjective:  HD 1   Overnight events, no seizures.  Patient was seen at bedside this morning. Patient was sleeping, but easily arousable. He remained alert, but was only oriented to self and that he is in [place]. He answered "don't know" immediately to his age, year, city, or situation. On second encounter with attending, patient did not remember this author from about 1 hour prior.  Patient latently denied about any changes in medical regiment, diet or anything that could have brought up the event.  Did report having regular bowel movements, once daily. He denied diarrhea, constipation, chest pain, shortness of breath, and abdominal pain.  Objective:  Vital signs in last 24 hours: Vitals:   10/08/20 1111 10/08/20 1133 10/08/20 1211 10/08/20 1621  BP: 109/80 113/76 125/89 113/74  Pulse:   68 73  Resp:   17 16  Temp:   98 F (36.7 C) 98.3 F (36.8 C)  TempSrc:   Oral Oral  SpO2:   100% 100%  Weight:      Height:       Physical Exam Vitals and nursing note reviewed.  HENT:     Head: Normocephalic.  Eyes:     Extraocular Movements: Extraocular movements intact.  Cardiovascular:     Rate and Rhythm: Normal rate and regular rhythm.     Heart sounds: Normal heart sounds.  Pulmonary:     Effort: Pulmonary effort is normal.     Breath sounds: Normal breath sounds.  Abdominal:     General: Abdomen is flat.     Palpations: Abdomen is soft.  Musculoskeletal:        General: No tenderness.     Right lower leg: No edema.     Left lower leg: No edema.     Comments: Patient has pain on motion of legs  Skin:    General: Skin is warm and dry.  Neurological:     Mental Status: He is alert. He is disoriented.     Comments: Only oriented to self and the state of [place]. See above for additional details.  Psychiatric:     Comments: Speech is clear, coherent, non-spontaneous     Assessment/Plan:  Principal Problem:   Seizure ([place]) Active Problems:   Essential hypertension    Depression   Ulcerative colitis ([place])  #Seizure disorder Patient has a history of seizure disorder complicated by anoxic brain injury. No seizures overnights. Currently there are no evidence on today's labs for metabolic or infectious causes of patient's seizure. Patient is afebrile, no leukocytosis, and lactic acid 0.9. Hb 14.8. CMP revealed a potassium 3.3, otherwise no electrolyte abnormalities. Glucose 87, Magnesium 2.0, phosphorous 4.2. UA negative for nitrites, leukocytes. Patient denied any changes in medical regiment or acute illnesses. He denied SOB, CP, abdominal pain, dysuria. Blood and urine culture ordered and processing. Considered malabsorption for cause for recent seizure due to history of UC, however patient described no UC relapse. Will CTM for GI symptoms. Considering MRI to identify any structural changes. Neurology consulted, greatly appreciate recommendations. Continue keppra 2057m PO BID Continue Vimpat 2073mPO BID   #Ulcerative colitis Per patient, UC seems to be in remission. He reported single bowel movement daily. He denied diarrhea, abdominal pain, and constipation. Continue home mesalamine 2.4g PO daily with breakfast CTM for changes in bowel movents and other symptoms  #Depression Continue home zoloft 10017mO daily  Prior to Admission [place] SNF Barriers to Discharge: Medical stability Dispo:  Anticipated discharge in approximately >2 day(s).   Merrily Brittle, DO 10/08/2020, 7:10 PM Pager: [phone] After 5pm on weekdays and 1pm on weekends: On Call pager [phone]

## 2020-10-08 NOTE — ED Notes (Signed)
Called to give report, told to call back.

## 2020-10-08 NOTE — Evaluation (Signed)
Clinical/Bedside Swallow Evaluation Patient Details  Name: Alexander Henderson MRN: [id number] Date of Birth: Mar 13, 1960  Today's Date: 10/08/2020 Time: SLP Start Time (ACUTE ONLY): 0905 SLP Stop Time (ACUTE ONLY): 0930 SLP Time Calculation (min) (ACUTE ONLY): 25 min  Past Medical History:  Past Medical History:  Diagnosis Date   Alcohol use    Anoxic encephalopathy (HCC)    secondary to cardiac arrest on 01/03/16.   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 PMH: seizures, anoxic brain injury, chronic back pain. EtOH, anxiety, depression, GERD, HLD, HTN, Parkinsonism, cardiac arrest, recurrent falls.   Assessment / Plan / Recommendation Clinical Impression  Pt was awake and alert during this assessment. CN exam unremarkable except for generalized oral motor weakness which results in mildly dysarthric speech. PT has most of his teeth, but is missing a few. Pt tolerated trials of thin liquid, puree, and solid textures without obvious oral issues or overt s/s aspiration. Pt reports no history of swallowing difficulty. Recommend regular solids with thin liquids, meds whole with liquid. No further ST intervention recommended at this time. Please reconsult if needs arise.  SLP Visit Diagnosis: Dysphagia, unspecified (R13.10)    Aspiration Risk  Mild aspiration risk    Diet  Recommendation Regular;Thin liquid   Liquid Administration via: Straw;Cup Medication Administration: Whole meds with liquid Supervision: Patient able to self feed Compensations: Slow rate;Small sips/bites Postural Changes: Seated upright at 90 degrees;Remain upright for at least 30 minutes after po intake    Other  Recommendations Oral Care Recommendations: Oral care BID   Follow up Recommendations None          Prognosis Prognosis for Safe Diet Advancement: Good      Swallow Study   General Date of Onset: 10/07/20 HPI: 60yo male admitted from [facility] 10/07/20 after unwitnessed fall. PMH: seizures, anoxic brain injury, chronic back pain. EtOH, anxiety, depression, GERD, HLD, HTN, Parkinsonism, cardiac arrest, recurrent falls. Type of Study: Bedside Swallow Evaluation Previous Swallow Assessment: MBS 06/2016 - no pen/asp, rec reg/thin. BSE 11/2019, rec reg/thin Diet Prior to this Study: NPO Temperature Spikes Noted: No Respiratory Status: Nasal cannula History of Recent Intubation: No Behavior/Cognition: Alert;Cooperative Oral Cavity Assessment: Within Functional Limits Oral Care Completed by SLP: No Oral Cavity - Dentition: Adequate natural dentition;Missing dentition Vision: Functional for self-feeding Self-Feeding Abilities: Able to feed self Patient Positioning: Upright in bed Baseline Vocal Quality: Normal Volitional Cough: Strong Volitional Swallow: Able to elicit    Oral/Motor/Sensory Function Overall Oral Motor/Sensory Function: Generalized oral weakness   Ice Chips Ice chips: Not tested   Thin Liquid Thin Liquid: Within functional limits Presentation: Straw;Self Fed    Nectar Thick Nectar Thick Liquid: Not tested   Honey Thick Honey Thick Liquid: Not tested  Puree Puree: Within functional limits Presentation: Spoon;Self Fed   Solid     Solid: Within functional limits Presentation: [place] [facility] [place], [facility], [place] Speech Language  Pathologist Office: [phone]  Shonna Chock 10/08/2020,9:33 AM

## 2020-10-09 ENCOUNTER — Inpatient Hospital Stay (HOSPITAL_COMMUNITY): Payer: Medicare (Managed Care)

## 2020-10-09 LAB — BASIC METABOLIC PANEL
Anion gap: 10 (ref 5–15)
BUN: 10 mg/dL (ref 6–20)
CO2: 26 mmol/L (ref 22–32)
Calcium: 8.7 mg/dL — ABNORMAL LOW (ref 8.9–10.3)
Chloride: 103 mmol/L (ref 98–111)
Creatinine, Ser: 0.82 mg/dL (ref 0.61–1.24)
GFR, Estimated: >60 mL/min (ref >60)
Glucose, Bld: 94 mg/dL (ref 70–99)
Potassium: 3.8 mmol/L (ref 3.5–5.1)
Sodium: 139 mmol/L (ref 135–145)

## 2020-10-09 LAB — CBC
HCT: 41.4 % (ref 39.0–52.0)
Hemoglobin: 14 g/dL (ref 13.0–17.0)
MCH: 31.4 pg (ref 26.0–34.0)
MCHC: 33.8 g/dL (ref 30.0–36.0)
MCV: 92.8 fL (ref 80.0–100.0)
Platelets: 223 10*3/uL (ref 150–400)
RBC: 4.46 MIL/uL (ref 4.22–5.81)
RDW: 12.5 % (ref 11.5–15.5)
WBC: 8.7 10*3/uL (ref 4.0–10.5)
nRBC: 0 % (ref 0.0–0.2)

## 2020-10-09 LAB — GLUCOSE, CAPILLARY: Glucose-Capillary: 90 mg/dL (ref 70–99)

## 2020-10-09 LAB — URINE CULTURE: Culture: <10000 — AB

## 2020-10-09 LAB — LIPID PANEL
Cholesterol: 208 mg/dL — ABNORMAL HIGH (ref 0–200)
HDL: 33 mg/dL — ABNORMAL LOW (ref >40)
LDL Cholesterol: 152 mg/dL — ABNORMAL HIGH (ref 0–99)
Total CHOL/HDL Ratio: 6.3 RATIO
Triglycerides: 117 mg/dL (ref <150)
VLDL: 23 mg/dL (ref 0–40)

## 2020-10-09 NOTE — Progress Notes (Signed)
Pt pulled Ivs out x 2.

## 2020-10-09 NOTE — Plan of Care (Signed)
  Problem: Clinical Measurements: Goal: Will remain free from infection Outcome: Progressing Goal: Diagnostic test results will improve Outcome: Progressing Goal: Respiratory complications will improve Outcome: Progressing Goal: Cardiovascular complication will be avoided Outcome: Progressing   Problem: Nutrition: Goal: Adequate nutrition will be maintained Outcome: Progressing   Problem: Elimination: Goal: Will not experience complications related to bowel motility Outcome: Progressing Goal: Will not experience complications related to urinary retention Outcome: Progressing   Problem: Pain Managment: Goal: General experience of comfort will improve Outcome: Progressing   Problem: Skin Integrity: Goal: Risk for impaired skin integrity will decrease Outcome: Progressing   Problem: Health Behavior/Discharge Planning: Goal: Compliance with prescribed medication regimen will improve Outcome: Progressing   Problem: Medication: Goal: Risk for medication side effects will decrease Outcome: Progressing

## 2020-10-09 NOTE — Progress Notes (Signed)
Pt states he pulled out 2 IV's and currently refuses another placement.  Discussed with RN Ty who will reconsult if pt need arises.

## 2020-10-09 NOTE — Progress Notes (Signed)
Pt has tried to get out of bed x 2, bed alarm going off. Education provided assisted back to bed. Pt used urinal x 1. New condom cath re applied.

## 2020-10-09 NOTE — Progress Notes (Signed)
0630 pt alarm went off. Pt noted at the edge of the bed trying to get out of the bed. Pt stated he was getting out of here. Pt's bed noted to be wet. Pts IV noted to bedside table. Pt informed of need to change sheets to bed. Pt stated he wanted to to stay out of the bed until sheets were changed.Pt unsteady when standing. Pt assisted back into bed and alarm reset. RN went to find clean sheets, when arrived back to room pt noted on floor. PT stated he fell on his knees. Pt assessed Redness, abraison noted to right upper back, abrasion noted to right for arm elbow. Pt assisted back to bed. VS taken. 0645 On call Provider paged to inform of pts fall.

## 2020-10-09 NOTE — Progress Notes (Addendum)
Subjective:  HD 2   Overnight events, had an unwitnessed fall and pulled out his IVs this morning before rounds.  Patient reported to RN that he did not do his head, or lose consciousness.  Per RN, his bed was soaked in urine.  Patient was seen at bedside this morning. Patient was alert, but only oriented to self.    Encounter 1: Patient did not remember this author from day prior. He complained of diffuse body ache, that is improved from yesterday.  Ported poor sleep, about 1 to 2 hours.  Stated that he does not remember falling earlier this morning for pulling out his IVs.  Encounter 2: Patient did not remember this author from earlier that morning.  He denied diarrhea, constipation, chest pain, shortness of breath, abdominal pain, and any neuro symptoms.  Did admit to past fleeting passive SI with no plan, where he reported that there are some days he does not want to wake up from sleep. He denied SI and depression today.  Objective:  Vital signs in last 24 hours: Vitals:   10/09/20 0640 10/09/20 0818 10/09/20 1121 10/09/20 1520  BP: 109/78 118/80 118/81 117/85  Pulse: 77 60 71 72  Resp: 18 18 18 18   Temp: 97.7 F (36.5 C) 97.7 F (36.5 C) 98.3 F (36.8 C) 97.9 F (36.6 C)  TempSrc: Axillary Oral Oral Oral  SpO2: 95% 92% 99% 98%  Weight:      Height:       Physical Exam Vitals and nursing note reviewed.  HENT:     Head: Normocephalic and atraumatic.  Eyes:     Extraocular Movements: Extraocular movements intact.  Cardiovascular:     Rate and Rhythm: Normal rate and regular rhythm.     Heart sounds: Normal heart sounds.  Pulmonary:     Effort: Pulmonary effort is normal.     Breath sounds: Normal breath sounds.  Musculoskeletal:        General: Normal range of motion.     Cervical back: Normal range of motion.  Skin:    General: Skin is warm and dry.  Neurological:     General: No focal deficit present.     Mental Status: He is alert. He is disoriented.     Assessment/Plan:  Principal Problem:   Seizure [facility]) Active Problems:   Essential hypertension   Depression   Ulcerative colitis ([place])  #Seizure disorder Patient has a history of seizure disorder complicated by anoxic brain injury. No seizures overnights. Considered vascular cause given LDL 152, cholesterol 208, HDL 33.  However BP within normal limits, SBP 105-118, DBP 72-85, patient does not have any signs and symptoms of focal neuro deficits, and that his seizures are generalized makes this less likely.  We will hold off on MRI. Considered lack of sleep as possible cause for breakthrough seizure, given patient reported sleeping about 1 to 2 hours a night.  However due to fall this morning and breakthrough seizures, will hold off on sleeping aids. Neurology consulted, greatly appreciate recommendations. Continue keppra 2040m PO BID Continue Vimpat 2043mPO BID   #Ulcerative colitis in remission Per patient, UC seems to be in remission. He reported single bowel movement daily. He denied diarrhea, abdominal pain, and constipation. Continue home mesalamine 2.4g PO daily with breakfast CTM for changes in bowel movents and other symptoms   #Depression Patient denied symptoms of depression and was negative for depression screening today. Given worsening chronic condition and past fleeting passive SI without  a plan, suspect patient may be minimizing his depression. Considered increasing home Zoloft to 150 mg, however will hold off today due to unwitnessed fall this morning. Also consider cross tapering to Celexa or Lexapro after discharge to minimize effect on possible future anti-epileptics. Continue home zoloft 145m PO daily   Prior to Admission [place] [facility] Barriers to Discharge: Medical management Dispo: Anticipated discharge in approximately 1-2 day(s).   NMerrily Brittle DO 10/09/2020, 4:44 PM Pager:  [phone] 5pm on weekdays and 1pm on weekends: On Call pager [phone]

## 2020-10-09 NOTE — Plan of Care (Signed)
Neurology Plan of Care  Chart reviewed, no acute overnight events, no further seizures noted while inpatient.  Vimpat level pending, however, patient at max dose and level will not change current management. Vimpat level to evaluate for compliance as breakthrough seizure etiology.  Infectious work up is unremarkable, no leukocytosis, afebrile.  Keppra increased to 2000 mg BID due to breakthrough seizure without clear etiology at this time.   Recommendations: - Lacosamide level pending for evaluation of compliance  - Continue Keppra at increased dose of 2000 mg BID - Continue lacosamide 271m two times daily  - Continue seizure precautions - 2 mg IV Versed PRN and seizure lasting greater than 5 minutes and notify neurology - No further neurology recommendations at this time. Please contact neurology for further questions or concerns.   Discussed with attending MD, Dr. CTheda Serswho is in agreement.  SAnibal Henderson AGACNP-BC [facility] [phone]

## 2020-10-09 NOTE — Progress Notes (Signed)
Pts wife Ausencio Vaden called to inform of pts fall this morning via telephone.

## 2020-10-10 LAB — CBC
HCT: 40.3 % (ref 39.0–52.0)
Hemoglobin: 14.1 g/dL (ref 13.0–17.0)
MCH: 31.9 pg (ref 26.0–34.0)
MCHC: 35 g/dL (ref 30.0–36.0)
MCV: 91.2 fL (ref 80.0–100.0)
Platelets: 224 10*3/uL (ref 150–400)
RBC: 4.42 MIL/uL (ref 4.22–5.81)
RDW: 12.3 % (ref 11.5–15.5)
WBC: 7.8 10*3/uL (ref 4.0–10.5)
nRBC: 0 % (ref 0.0–0.2)

## 2020-10-10 LAB — BASIC METABOLIC PANEL
Anion gap: 11 (ref 5–15)
BUN: 11 mg/dL (ref 6–20)
CO2: 27 mmol/L (ref 22–32)
Calcium: 8.8 mg/dL — ABNORMAL LOW (ref 8.9–10.3)
Chloride: 101 mmol/L (ref 98–111)
Creatinine, Ser: 0.75 mg/dL (ref 0.61–1.24)
GFR, Estimated: >60 mL/min (ref >60)
Glucose, Bld: 94 mg/dL (ref 70–99)
Potassium: 3.7 mmol/L (ref 3.5–5.1)
Sodium: 139 mmol/L (ref 135–145)

## 2020-10-10 LAB — GLUCOSE, CAPILLARY: Glucose-Capillary: 98 mg/dL (ref 70–99)

## 2020-10-10 MED ORDER — LEVETIRACETAM 1000 MG PO TABS: 2000.0000 mg | ORAL_TABLET | Freq: Two times a day (BID) | ORAL | 0 refills | Status: AC

## 2020-10-10 NOTE — Plan of Care (Signed)
  Problem: Education: Goal: Knowledge of General Education information will improve Description: Including pain rating scale, medication(s)/side effects and non-pharmacologic comfort measures Outcome: Progressing   Problem: Clinical Measurements: Goal: Ability to maintain clinical measurements within normal limits will improve Outcome: Progressing Goal: Will remain free from infection Outcome: Progressing Goal: Diagnostic test results will improve Outcome: Progressing Goal: Respiratory complications will improve Outcome: Progressing Goal: Cardiovascular complication will be avoided Outcome: Progressing   Problem: Safety: Goal: Verbalization of understanding the information provided will improve Outcome: Progressing

## 2020-10-10 NOTE — NC FL2 (Signed)
[place] MEDICAID FL2 LEVEL OF CARE SCREENING TOOL     IDENTIFICATION  Patient Name: Alexander Henderson Birthdate: Sep 18, 1960 Sex: male Admission Date (Current Location): 10/07/2020  [facility] and [place] Number:  Herbalist and Address:  [facility]. [facility], [place] [address], [place],  [address]      Provider Number: [id number]  Attending Physician Name and Address:  Aldine Contes, MD  Relative Name and Phone Number:       Current Level of Care: Hospital Recommended Level of Care: [place] Prior Approval Number:    Date Approved/Denied:   PASRR Number:    Discharge Plan: SNF    Current Diagnoses: Patient Active Problem List   Diagnosis Date Noted   Seizure ([place]) 10/07/2020   Gait abnormality 09/01/2016   Seizures ([place]) 09/01/2016   Anoxic brain damage ([place]) 09/01/2016   Essential hypertension 04/07/2016   Gastroesophageal reflux disease 04/07/2016   Depression 04/07/2016   Ulcerative colitis ([place]) 04/07/2016   Hyperlipidemia 04/07/2016    Orientation RESPIRATION BLADDER Height & Weight     Self, Situation, Place  Normal Incontinent Weight: 196 lb 13.9 oz (89.3 kg) Height:  5' 10"  (177.8 cm)  BEHAVIORAL SYMPTOMS/MOOD NEUROLOGICAL BOWEL NUTRITION STATUS    Convulsions/Seizures Incontinent Diet (regular)  AMBULATORY STATUS COMMUNICATION OF NEEDS Skin   Extensive Assist Verbally Normal                       Personal Care Assistance Level of Assistance  Bathing, Feeding, Dressing Bathing Assistance: Maximum assistance Feeding assistance: Limited assistance Dressing Assistance: Maximum assistance     Functional Limitations Info             SPECIAL CARE FACTORS FREQUENCY                       Contractures Contractures Info: Not present    Additional Factors Info  Code Status, Allergies, Psychotropic Code Status Info: DNR Allergies Info: Fluconazole Psychotropic Info: Zoloft 153m daily          Current Medications (10/10/2020):  This is the current hospital active medication list Current Facility-Administered Medications  Medication Dose Route Frequency Provider Last Rate Last Admin   acetaminophen (TYLENOL) tablet 650 mg  650 mg Oral Q6H PRN CMarianna Payment MD   650 mg at 10/09/20 2006   Or   acetaminophen (TYLENOL) suppository 650 mg  650 mg Rectal Q6H PRN CMarianna Payment MD       enoxaparin (LOVENOX) injection 40 mg  40 mg Subcutaneous Q24H CMarianna Payment MD   40 mg at 10/09/20 1646   lacosamide (VIMPAT) tablet 200 mg  200 mg Oral BID TAnibal HendersonW, NP   200 mg at 10/09/20 2006   levETIRAcetam (KEPPRA) tablet 2,000 mg  2,000 mg Oral BID TAnibal HendersonW, NP   2,000 mg at 10/09/20 2006   melatonin tablet 3 mg  3 mg Oral QHS TRikki Spearing NP   3 mg at 10/09/20 2007   mesalamine (LIALDA) EC tablet 2.4 g  2.4 g Oral Q breakfast TAnibal HendersonW, NP   2.4 g at 10/10/20 09509  sertraline (ZOLOFT) tablet 100 mg  100 mg Oral Daily TRikki Spearing NP   100 mg at 10/09/20 1025   sodium chloride flush (NS) 0.9 % injection 3 mL  3 mL Intravenous QZettie Cooley MD   3 mL at 10/08/20 2105     Discharge  Medications: Please see discharge summary for a list of discharge medications.  Relevant Imaging Results:  Relevant Lab Results:   Additional Information SS#: [id number]  Geralynn Ochs, LCSW

## 2020-10-10 NOTE — TOC Transition Note (Signed)
Transition of Care [facility]) - CM/SW Discharge Note   Patient Details  Name: Alexander Henderson MRN: [id number] Date of Birth: [birth year]  Transition of [facility]) CM/SW Contact:  Geralynn Ochs, LCSW Phone Number: 10/10/2020, 12:58 PM   Clinical Narrative:   Nurse to call report to [phone].    Final next level of care: Skilled Nursing Facility Barriers to Discharge: Barriers Resolved   Patient Goals and CMS Choice        Discharge Placement              Patient [facility] bed at: [facility] Patient to be transferred to facility by: [facility] Name of family member notified: Self Patient and family notified of of transfer: 10/10/20  Discharge Plan and Services In-house Referral: Clinical Social Work Discharge Planning Services: CM Consult                                 Social Determinants of Health (SDOH) Interventions     Readmission Risk Interventions No flowsheet data found.

## 2020-10-10 NOTE — Plan of Care (Signed)
Pt is alert to self. Pt rested the majority of the night. Condom cath in place for urine out put.    Problem: Education: Goal: Knowledge of General Education information will improve Description: Including pain rating scale, medication(s)/side effects and non-pharmacologic comfort measures Outcome: Progressing   Problem: Health Behavior/Discharge Planning: Goal: Ability to manage health-related needs will improve Outcome: Progressing   Problem: Clinical Measurements: Goal: Ability to maintain clinical measurements within normal limits will improve Outcome: Progressing Goal: Will remain free from infection Outcome: Progressing Goal: Diagnostic test results will improve Outcome: Progressing Goal: Respiratory complications will improve Outcome: Progressing Goal: Cardiovascular complication will be avoided Outcome: Progressing   Problem: Activity: Goal: Risk for activity intolerance will decrease Outcome: Progressing   Problem: Nutrition: Goal: Adequate nutrition will be maintained Outcome: Progressing   Problem: Coping: Goal: Level of anxiety will decrease Outcome: Progressing   Problem: Elimination: Goal: Will not experience complications related to bowel motility Outcome: Progressing Goal: Will not experience complications related to urinary retention Outcome: Progressing   Problem: Pain Managment: Goal: General experience of comfort will improve Outcome: Progressing   Problem: Safety: Goal: Ability to remain free from injury will improve Outcome: Progressing   Problem: Skin Integrity: Goal: Risk for impaired skin integrity will decrease Outcome: Progressing   Problem: Education: Goal: Expressions of having a comfortable level of knowledge regarding the disease process will increase Outcome: Progressing   Problem: Coping: Goal: Ability to adjust to condition or change in health will improve Outcome: Progressing Goal: Ability to identify appropriate support  needs will improve Outcome: Progressing   Problem: Health Behavior/Discharge Planning: Goal: Compliance with prescribed medication regimen will improve Outcome: Progressing   Problem: Medication: Goal: Risk for medication side effects will decrease Outcome: Progressing   Problem: Clinical Measurements: Goal: Complications related to the disease process, condition or treatment will be avoided or minimized Outcome: Progressing Goal: Diagnostic test results will improve Outcome: Progressing   Problem: Safety: Goal: Verbalization of understanding the information provided will improve Outcome: Progressing   Problem: Self-Concept: Goal: Level of anxiety will decrease Outcome: Progressing Goal: Ability to verbalize feelings about condition will improve Outcome: Progressing

## 2020-10-10 NOTE — Care Management Important Message (Signed)
Important Message  Patient Details  Name: Alexander Henderson MRN: [id number] Date of Birth: 12-04-1960   Medicare Important Message Given:  Yes     Douglass [address] 10/10/2020, 3:05 PM

## 2020-10-10 NOTE — Discharge Summary (Signed)
Name: Alexander Henderson MRN: [id number] DOB: 1960-02-16 60 y.o. PCP: Alexander Pacas, MD  Date of Admission: 10/07/2020  5:50 AM Date of Discharge: No discharge date for patient encounter. Attending Physician: Alexander Contes, MD  Discharge Diagnosis: Seizure Hyperlipidemia Depression Ulcerative colitis-in remission  Discharge Medications: Allergies as of 10/10/2020       Reactions   Fluconazole Hives, Rash        Medication List     TAKE these medications    acetaminophen 500 MG tablet Commonly known as: TYLENOL Take 500 mg by mouth every 8 (eight) hours as needed for mild pain or fever.   bisacodyl 10 MG suppository Commonly known as: DULCOLAX Place 10 mg rectally See admin instructions. Give 1 suppository (10 mg totally) rectally 1 dose in 24 hours as needed if not relief by Milk of Magnesium   calcium-vitamin D 500-200 MG-UNIT tablet Commonly known as: OSCAL WITH D Take 1 tablet by mouth 2 (two) times daily.   lacosamide 200 MG Tabs tablet Commonly known as: Vimpat Take 1 tablet (200 mg total) by mouth 2 (two) times daily.   levETIRAcetam 1000 MG tablet Commonly known as: KEPPRA Take 2 tablets (2,000 mg total) by mouth 2 (two) times daily. Administer as a whole tablet. Do NOT crush, break or chew. What changed:  medication strength how much to take additional instructions Another medication with the same name was removed. Continue taking this medication, and follow the directions you see here.   melatonin 3 MG Tabs tablet Take 3 mg by mouth at bedtime.   mesalamine 1.2 g EC tablet Commonly known as: LIALDA Take 2.4 g by mouth daily with breakfast.   MILK OF MAGNESIA PO Take 30 mLs by mouth See admin instructions. Give 30 ml Milk of Magnesium by mouth 1 time in 24 hours as needed of no bowel movement in 3 days   nystatin powder Commonly known as: MYCOSTATIN/NYSTOP Apply 1 application topically 3 (three) times daily as needed (skin folds/rash). To  groin   RA SALINE ENEMA RE Place 1 each rectally See admin instructions. Give 1 dose rectally disposable Saline Enema in 24 hours as needed if not relief by Bisacodyl suppository   sennosides-docusate sodium 8.6-50 MG tablet Commonly known as: SENOKOT-S Take 2 tablets by mouth at bedtime as needed for constipation.   sertraline 100 MG tablet Commonly known as: ZOLOFT Take 100 mg by mouth daily.   zinc oxide 11.3 % Crea cream Commonly known as: BALMEX Apply 1 application topically See admin instructions. Apply into the bottom and perineal area 6 times a days as needed for redness        Disposition and follow-up:   Alexander Henderson was discharged from [facility] in Stable condition.  At the hospital follow up visit please address:  Breakthrough seizure. No clear etiology for reduced seizure threshold. Will be important to make sure patient continues to take medication as prescribed. Increased morning Keppra dose to 2060m. Current regiment is Keppra 2000 mg BID and Vimpat 200 mg BID.  Consider switching from Keppra 2000 mg BID to long-acting Keppra to help with compliance. 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  He is alert and oriented only to self.  Per wife, this appears to be his baseline. Hyperlipidemia.  LDL 152, cholesterol 208, HDL 33, ASCVD 8.7%.  Considered starting statin inpatient, however due to diffuse muscle aches from breakthrough seizure, decided to hold off until discharge. Depression. Currently on Zoloft 100 mg  daily. Negative depression screening during hospitalization, however suspect he is minimizing his depression.  Due to patient's worsening condition, suspect depression will worsen. Consider increasing home Zoloft to 150 mg daily or consider cross tapering to Celexa or Lexapro after discharge to minimize effect on possible future anti-epileptics. Ulcerative colitis in remission.  Regimen of mesalamine 2.4g PO daily with  breakfast.  Continue to monitor for changes in bowel movements.  Labs / imaging needed at time of follow-up: CBC, CMP Pending labs/ test needing follow-up: None  Follow-up Appointments:  Contact information for after-discharge care     Destination     [facility] Preferred SNF .   Service: Skilled Nursing Contact information: [address] [place] [facility] Course by problem list: Alexander Henderson is a 60 y.o. 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   Patient had a witnessed General tonic-clonic seizure in the ED s/p Ativan and keppra load.  Patient's wife, at bedside in the ED, provided his history.  Wife reported that at baseline, patient is only oriented to self, is able to communicate his needs, can feed himself, has incontinence of bowel and bladder, and limitation in ambulation.  During admission patient did not have any more seizures however, overnight he did pull out his IVs and had an unwitnessed fall.  No clear etiology for his breakthrough seizure. No metabolic, infectious, toxic, or vascular cause evident on imaging and labs.  Did max out his Keppra to 4000 mg daily, divided into 2000 mg BID.  Then continued home Vimpat 200 mg BID.  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  No evidence metabolic or infectious causes of patient's seizure. Patient was afebrile, no leukocytosis, and lactic acid 0.9. Hb 14.8. CMP revealed a potassium 3.3, otherwise no electrolyte abnormalities. Glucose ran around 100, Magnesium 2.0, phosphorous 4.2. UA negative for nitrites, leukocytes.  Blood and  urine culture had no growth after 2 days.  CT head, cervical spine, lumbar spine were unremarkable. Patient denied any changes in medical regiment or acute illnesses. He denied SOB, CP, abdominal pain, dysuria. Considered vascular cause given LDL 152, cholesterol 208, HDL 33.  However BP within normal limits, SBP 105-118, DBP 72-85, patient does not have any signs and symptoms of focal neuro deficits, and that his seizures are generalized makes this less likely. Considered lack of sleep as possible cause for breakthrough seizure, given patient reported sleeping about 1 to 2 hours a night. Neurology consulted, greatly appreciate recommendations. Increased to Keppra 2075m PO BID, from Keppra 1500 mg qHS and 2000 mg qHS Continued home Vimpat 2091mPO BID   #Ulcerative colitis in remission Per patient, UC seems to be in remission. He reported single bowel movement daily. He denied diarrhea, abdominal pain, and constipation. Continued home mesalamine 2.4g PO daily with breakfast CTM for changes in bowel movents and other symptoms   #Depression Patient denied symptoms of depression and was negative for depression screening today. Given worsening chronic condition and past fleeting passive SI without a plan, suspect patient may be minimizing his depression. Considered increasing home Zoloft to 150 mg,  however will hold off today due to unwitnessed fall this morning. Also consider cross tapering to Celexa or Lexapro after discharge to minimize effect on possible future anti-epileptics. Continued home zoloft 167m PO daily  Discharge Exam:   BP 115/78 (BP Location: Left Arm)   Pulse 61   Temp 97.7 F (36.5 C) (Oral)   Resp 18   Ht 5' 10"  (1.778 m)   Wt 89.3 kg   SpO2 94%   BMI 28.25 kg/m  Discharge exam:  Physical Exam Vitals and nursing note reviewed.  HENT:     Head: Normocephalic and atraumatic.  Eyes:     Extraocular Movements: Extraocular movements intact.  Cardiovascular:     Rate and  Rhythm: Normal rate and regular rhythm.  Pulmonary:     Effort: Pulmonary effort is normal.     Breath sounds: Normal breath sounds.  Abdominal:     General: Abdomen is flat.     Palpations: Abdomen is soft.  Musculoskeletal:     Cervical back: Normal range of motion.  Neurological:     Mental Status: He is alert. He is disoriented.     Cranial Nerves: Cranial nerve deficit: oriented to self only.     Gait: Gait abnormal.     Comments: Speech is clear and coherent, spontaneous, however slow speed.  Psychiatric:     Comments: Affect mildly anxious and guarded, mood congruent.       Pertinent Labs, Studies, and Procedures:   CT HEAD WITHOUT CONTRAST (9/13)   TECHNIQUE: Contiguous axial images were obtained from the base of the skull through the vertex without intravenous contrast.   COMPARISON:  11/29/2019   FINDINGS: Brain: Mild generalized volume loss. No evidence of old or acute focal infarction, mass lesion, hemorrhage, hydrocephalus or extra-axial collection.   Vascular: No abnormal vascular finding.   Skull: No skull fracture.   Sinuses/Orbits: Clear/normal   Other: None   IMPRESSION: No acute or traumatic finding.  Mild generalized atrophy.  2.  CT CERVICAL SPINE WITHOUT CONTRAST (9/13)   TECHNIQUE: Multidetector CT imaging of the cervical spine was performed without intravenous contrast. Multiplanar CT image reconstructions were also generated.   COMPARISON:  11/17/2017   FINDINGS: Alignment: No traumatic malalignment.   Skull base and vertebrae: No regional fracture or focal bone lesion.   Soft tissues and spinal canal: No significant soft tissue finding.   Disc levels: Ordinary mid cervical spondylosis with uncovertebral osteophytes at C5-6 and C6-7. Mild scratch set bony foraminal narrowing at those levels. No apparent canal stenosis. No evidence of soft disc pathology by CT.   Upper chest: Some emphysematous changes.  No acute finding.    Other: None   IMPRESSION: No acute or traumatic finding. Ordinary mid cervical spondylosis with bony foraminal narrowing at C5-6 and C6-7.  3. CT LUMBAR SPINE WITHOUT CONTRAST (9/13)   TECHNIQUE: Multidetector CT imaging of the lumbar spine was performed without intravenous contrast administration. Multiplanar CT image reconstructions were also generated.   COMPARISON:  None   FINDINGS: Segmentation: 5 lumbar type vertebral bodies.   Alignment: Mild scoliotic curvature.   Vertebrae: No fracture or acute traumatic finding.   Paraspinal and other soft tissues: Aortic atherosclerosis. Otherwise negative.   Disc levels: No significant disc level pathology at T12-L1.   L1-2: Chronic disc space narrowing with bridging osteophytes. No canal or foraminal stenosis.   L2-3: Minimal disc bulge.  No stenosis.   L3-4: Chronic disc degeneration with loss of disc height, endplate osteophytes and  sclerotic endplate changes more pronounced on the right. Mild narrowing of the right lateral recess and intervertebral foramen but no apparent neural compression.   L4-5: Mild disc bulge. Mild facet degeneration and hypertrophy. No compressive stenosis.   L5-S1: Minimal disc bulge.  Mild facet degeneration.  No stenosis.   IMPRESSION: No acute or traumatic finding. Mild scoliosis. Mild-to-moderate degenerative changes which could relate to back pain. No apparent neural compressive stenosis.  4. CT HEAD WITHOUT CONTRAST (9/14)   TECHNIQUE: Contiguous axial images were obtained from the base of the skull through the vertex without intravenous contrast.   COMPARISON:  November 29, 2019.   FINDINGS: Brain: No evidence of acute infarction, hemorrhage, hydrocephalus, extra-axial collection or mass lesion/mass effect.   Vascular: No hyperdense vessel or unexpected calcification.   Skull: Normal. Negative for fracture or focal lesion.   Sinuses/Orbits: No acute finding.   Other:  None.   IMPRESSION: No acute intracranial abnormality seen.   Discharge Instructions: Discharge Instructions     (HEART FAILURE PATIENTS) Call MD:  Anytime you have any of the following symptoms: 1) 3 pound weight gain in 24 hours or 5 pounds in 1 week 2) shortness of breath, with or without a dry hacking cough 3) swelling in the hands, feet or stomach 4) if you have to sleep on extra pillows at night in order to breathe.   Complete by: As directed    Call MD for:  difficulty breathing, headache or visual disturbances   Complete by: As directed    Call MD for:  extreme fatigue   Complete by: As directed    Call MD for:  hives   Complete by: As directed    Call MD for:  persistant dizziness or light-headedness   Complete by: As directed    Call MD for:  persistant nausea and vomiting   Complete by: As directed    Call MD for:  redness, tenderness, or signs of infection (pain, swelling, redness, odor or green/yellow discharge around incision site)   Complete by: As directed    Call MD for:  severe uncontrolled pain   Complete by: As directed    Call MD for:  temperature >100.4   Complete by: As directed    Diet - low sodium heart healthy   Complete by: As directed    Discharge instructions   Complete by: As directed    Dear Alexander Henderson, I am so glad you are feeling better and can be discharged today! You were admitted because of breakthrough seizure.   Please see the following instructions: 1.  To prevent seizures, please continue taking your Keppra and Vimpat 2 times a day, morning and night. 2.  To prevent your ulcerative colitis from flaring, please continue taking mesalamine every day with breakfast. 3.  For your mood, continue taking your Zoloft every day.  It was a pleasure meeting you, Alexander Henderson. I wish you and your family the best, and hope you stay happy and healthy!   Increase activity slowly   Complete by: As directed        Signed: Merrily Brittle,  DO 10/10/2020, 12:53 PM   Pager: [phone]

## 2020-10-13 LAB — CULTURE, BLOOD (ROUTINE X 2)
Culture: NO GROWTH
Culture: NO GROWTH
Special Requests: ADEQUATE
Special Requests: ADEQUATE

## 2020-10-13 LAB — LACOSAMIDE: Lacosamide: 5 ug/mL (ref 5.0–10.0)

## 2020-11-06 ENCOUNTER — Encounter (HOSPITAL_COMMUNITY): Payer: Self-pay | Admitting: Radiology

## 2021-04-24 ENCOUNTER — Other Ambulatory Visit: Payer: Self-pay

## 2021-04-24 ENCOUNTER — Emergency Department (HOSPITAL_COMMUNITY)
Admission: EM | Admit: 2021-04-24 | Discharge: 2021-04-25 | Disposition: A | Discharge status: Skilled Nursing Facility | Payer: Medicare (Managed Care) | Attending: Emergency Medicine | Admitting: Emergency Medicine

## 2021-04-24 ENCOUNTER — Encounter (HOSPITAL_COMMUNITY): Payer: Self-pay

## 2021-04-24 DIAGNOSIS — W19XXXA Unspecified fall, initial encounter: Secondary | ICD-10-CM | POA: Diagnosis not present

## 2021-04-24 DIAGNOSIS — M542 Cervicalgia: Secondary | ICD-10-CM | POA: Diagnosis not present

## 2021-04-24 DIAGNOSIS — S0990XA Unspecified injury of head, initial encounter: Secondary | ICD-10-CM | POA: Diagnosis present

## 2021-04-24 DIAGNOSIS — F0390 Unspecified dementia without behavioral disturbance: Secondary | ICD-10-CM | POA: Insufficient documentation

## 2021-04-24 DIAGNOSIS — I10 Essential (primary) hypertension: Secondary | ICD-10-CM | POA: Diagnosis not present

## 2021-04-24 DIAGNOSIS — S0101XA Laceration without foreign body of scalp, initial encounter: Secondary | ICD-10-CM | POA: Insufficient documentation

## 2021-04-24 DIAGNOSIS — Y92129 Unspecified place in nursing home as the place of occurrence of the external cause: Secondary | ICD-10-CM | POA: Diagnosis not present

## 2021-04-24 DIAGNOSIS — S0083XA Contusion of other part of head, initial encounter: Secondary | ICD-10-CM

## 2021-04-24 NOTE — ED Triage Notes (Signed)
Pt BIB [facility] from [facility] after an unwitnessed fall. Per EMS unknown how long pt was on ground. Lac on head, c-collar on for precautionary measures. A&Ox1 at baseline. ?

## 2021-04-24 NOTE — ED Provider Notes (Signed)
?[facility] ?Provider Note ? ? ?CSN: [id number] ?Arrival date & time: 04/24/21  2247 ? ?  ? ?History ? ?Chief Complaint  ?Patient presents with  ? Fall  ? ? ?Alexander Henderson is a 61 y.o. male. ? ?The history is provided by the patient. The history is limited by the condition of the patient (Dementia).  ?Fall ?He has history of hypertension, hyperlipidemia, anoxic brain injury and comes in after having had an unwitnessed fall at the skilled nursing facility where he lives.  He was noted to have some facial bruising.  Patient is not able to relate any history. ?  ?Home Medications ?Prior to Admission medications   ?Medication Sig Start Date End Date Taking? Authorizing Provider  ?acetaminophen (TYLENOL) 500 MG tablet Take 500 mg by mouth every 8 (eight) hours as needed for mild pain or fever.     [provider]  ?bisacodyl (DULCOLAX) 10 MG suppository Place 10 mg rectally See admin instructions. Give 1 suppository (10 mg totally) rectally 1 dose in 24 hours as needed if not relief by Milk of Magnesium    [provider]  ?calcium-vitamin D (OSCAL WITH D) 500-200 MG-UNIT tablet Take 1 tablet by mouth 2 (two) times daily.    [provider]  ?lacosamide (VIMPAT) 200 MG TABS tablet Take 1 tablet (200 mg total) by mouth 2 (two) times daily. 06/09/16   Charlesetta Shanks, MD  ?levETIRAcetam (KEPPRA) 1000 MG tablet Take 2 tablets (2,000 mg total) by mouth 2 (two) times daily. Administer as a whole tablet. Do NOT crush, break or chew. 10/10/20 11/09/20  Merrily Brittle, DO  ?Magnesium Hydroxide (MILK OF MAGNESIA PO) Take 30 mLs by mouth See admin instructions. Give 30 ml Milk of Magnesium by mouth 1 time in 24 hours as needed of no bowel movement in 3 days    [provider]  ?melatonin 3 MG TABS tablet Take 3 mg by mouth at bedtime.    [provider]  ?mesalamine (LIALDA) 1.2 g EC tablet Take 2.4 g by mouth daily with breakfast.     [provider]  ?nystatin (MYCOSTATIN/NYSTOP) powder Apply 1 application topically 3 (three) times daily as needed (skin folds/rash). To groin    [provider]  ?sennosides-docusate sodium (SENOKOT-S) 8.6-50 MG tablet Take 2 tablets by mouth at bedtime as needed for constipation.    [provider]  ?sertraline (ZOLOFT) 100 MG tablet Take 100 mg by mouth daily.     [provider]  ?Sodium Phosphates (RA SALINE ENEMA RE) Place 1 each rectally See admin instructions. Give 1 dose rectally disposable Saline Enema in 24 hours as needed if not relief by Bisacodyl suppository    [provider]  ?zinc oxide (BALMEX) 11.3 % CREA cream Apply 1 application topically See admin instructions. Apply into the bottom and perineal area 6 times a days as needed for redness    [provider]  ?   ? ?Allergies    ?Fluconazole   ? ?Review of Systems   ?Review of Systems  ?Unable to perform ROS: Dementia  ?All other systems reviewed and are negative. ? ?Physical Exam ?Updated Vital Signs ?BP 124/89   Pulse 68   Resp 16   SpO2 97%  ?Physical Exam ?Vitals and nursing note reviewed.  ?61 year old male, resting comfortably and in no acute distress. Vital signs are normal. Oxygen saturation is 97%, which is normal. ?Head is normocephalic.  Small laceration is  noted on the right frontal scalp.  Right periorbital ecchymosis is noted without swelling or deformity. PERRLA, EOMI.  ?Neck is immobilized in a stiff cervical collar.  There is moderate midline tenderness posteriorly. ?Back is nontender and there is no CVA tenderness. ?Lungs are clear without rales, wheezes, or rhonchi. ?Chest is nontender. ?Heart has regular rate and rhythm without murmur. ?Abdomen is soft, flat, nontender. ?Extremities have no cyanosis or edema, full range of motion is present. ?Skin is warm and dry without rash. ?Neurologic: Awake and alert, oriented to person and partly oriented to place but not oriented to time.   This is his baseline.  Cranial nerves are intact.  He moves all extremities equally. ? ?ED Results / Procedures / Treatments   ? ?Radiology ?CT Head Wo Contrast ? ?Result Date: 04/25/2021 ?CLINICAL DATA:  Head trauma, moderate-severe EXAM: CT HEAD WITHOUT CONTRAST TECHNIQUE: Contiguous axial images were obtained from the base of the skull through the vertex without intravenous contrast. RADIATION DOSE REDUCTION: This exam was performed according to the departmental dose-optimization program which includes automated exposure control, adjustment of the mA and/or kV according to patient size and/or use of iterative reconstruction technique. COMPARISON:  10/09/2020 FINDINGS: Brain: No acute intracranial abnormality. Specifically, no hemorrhage, hydrocephalus, mass lesion, acute infarction, or significant intracranial injury. Vascular: No hyperdense vessel or unexpected calcification. Skull: No acute calvarial abnormality. Sinuses/Orbits: No acute findings Other: None IMPRESSION: No acute intracranial abnormality. Electronically Signed   By: Rolm Baptise M.D.   On: 04/25/2021 01:05  ? ?CT Cervical Spine Wo Contrast ? ?Result Date: 04/25/2021 ?CLINICAL DATA:  Neck trauma, dangerous injury mechanism (Age 60-64y) EXAM: CT CERVICAL SPINE WITHOUT CONTRAST TECHNIQUE: Multidetector CT imaging of the cervical spine was performed without intravenous contrast. Multiplanar CT image reconstructions were also generated. RADIATION DOSE REDUCTION: This exam was performed according to the departmental dose-optimization program which includes automated exposure control, adjustment of the mA and/or kV according to patient size and/or use of iterative reconstruction technique. COMPARISON:  10/07/2020 FINDINGS: Alignment: Normal Skull base and vertebrae: No acute fracture. No primary bone lesion or focal pathologic process. Soft tissues and spinal canal: No prevertebral fluid or swelling. No visible canal hematoma. Disc levels: Disc space  narrowing and spurring at C5-6 and C6-7. Early bilateral degenerative facet disease. Upper chest: No acute findings Other: None IMPRESSION: No acute bony abnormality. Mild degenerative disc and facet disease. Electronically Signed   By: Rolm Baptise M.D.   On: 04/25/2021 01:07  ? ?CT Maxillofacial Wo Contrast ? ?Result Date: 04/25/2021 ?CLINICAL DATA:  Facial trauma, blunt EXAM: CT MAXILLOFACIAL WITHOUT CONTRAST TECHNIQUE: Multidetector CT imaging of the maxillofacial structures was performed. Multiplanar CT image reconstructions were also generated. RADIATION DOSE REDUCTION: This exam was performed according to the departmental dose-optimization program which includes automated exposure control, adjustment of the mA and/or kV according to patient size and/or use of iterative reconstruction technique. COMPARISON:  None. FINDINGS: Osseous: No fracture or mandibular dislocation. No destructive process. Orbits: Negative. No traumatic or inflammatory finding. Sinuses: No air-fluid levels. Minimal mucosal thickening in the right paranasal sinuses. Soft tissues: Mild soft tissue swelling over the right face. Limited intracranial: See head CT report IMPRESSION: No facial or orbital fracture. Electronically Signed   By: Rolm Baptise M.D.   On: 04/25/2021 01:08   ? ?Procedures ?Marland Kitchen.Laceration Repair ? ?Date/Time: 04/25/2021 1:54 AM ?Performed by: Delora Fuel, MD ?Authorized by: Delora Fuel, MD  ? ?Consent:  ?  Consent obtained:  Verbal ?  Consent given  by:  Patient ?  Risks, benefits, and alternatives were discussed: yes   ?  Risks discussed:  Infection, pain and poor wound healing ?  Alternatives discussed:  No treatment ?Universal protocol:  ?  Procedure explained and questions answered to patient or proxy's satisfaction: yes   ?  Relevant documents present and verified: yes   ?  Test results available: yes   ?  Imaging studies available: yes   ?  Required blood products, implants, devices, and special equipment available: yes    ?  Site/side marked: yes   ?  Immediately prior to procedure, a time out was called: yes   ?  Patient identity confirmed:  Verbally with patient and arm band ?Anesthesia:  ?  Anesthesia method:  None ?Lacerati

## 2021-04-25 ENCOUNTER — Emergency Department (HOSPITAL_COMMUNITY): Payer: Medicare (Managed Care)

## 2021-04-25 NOTE — ED Notes (Signed)
[facility] called  

## 2022-11-13 ENCOUNTER — Emergency Department (HOSPITAL_COMMUNITY)
Admission: EM | Admit: 2022-11-13 | Discharge: 2022-11-13 | Disposition: A | Discharge status: Home / Self Care | Payer: Medicare (Managed Care) | Attending: Emergency Medicine | Admitting: Emergency Medicine

## 2022-11-13 ENCOUNTER — Other Ambulatory Visit: Payer: Self-pay

## 2022-11-13 ENCOUNTER — Emergency Department (HOSPITAL_COMMUNITY): Payer: Medicare (Managed Care)

## 2022-11-13 DIAGNOSIS — Z1152 Encounter for screening for COVID-19: Secondary | ICD-10-CM | POA: Insufficient documentation

## 2022-11-13 DIAGNOSIS — R569 Unspecified convulsions: Secondary | ICD-10-CM | POA: Insufficient documentation

## 2022-11-13 LAB — CBC WITH DIFFERENTIAL/PLATELET
Abs Immature Granulocytes: 0.03 10*3/uL (ref 0.00–0.07)
Basophils Absolute: 0 10*3/uL (ref 0.0–0.1)
Basophils Relative: 1 %
Eosinophils Absolute: 0.3 10*3/uL (ref 0.0–0.5)
Eosinophils Relative: 3 %
HCT: 44.5 % (ref 39.0–52.0)
Hemoglobin: 15.1 g/dL (ref 13.0–17.0)
Immature Granulocytes: 0 %
Lymphocytes Relative: 24 %
Lymphs Abs: 2.1 10*3/uL (ref 0.7–4.0)
MCH: 31.3 pg (ref 26.0–34.0)
MCHC: 33.9 g/dL (ref 30.0–36.0)
MCV: 92.3 fL (ref 80.0–100.0)
Monocytes Absolute: 0.6 10*3/uL (ref 0.1–1.0)
Monocytes Relative: 7 %
Neutro Abs: 5.8 10*3/uL (ref 1.7–7.7)
Neutrophils Relative %: 65 %
Platelets: 227 10*3/uL (ref 150–400)
RBC: 4.82 MIL/uL (ref 4.22–5.81)
RDW: 12.3 % (ref 11.5–15.5)
WBC: 8.8 10*3/uL (ref 4.0–10.5)
nRBC: 0 % (ref 0.0–0.2)

## 2022-11-13 LAB — URINALYSIS, ROUTINE W REFLEX MICROSCOPIC
Bilirubin Urine: NEGATIVE
Glucose, UA: NEGATIVE mg/dL
Hgb urine dipstick: NEGATIVE
Ketones, ur: 5 mg/dL — AB
Leukocytes,Ua: NEGATIVE
Nitrite: NEGATIVE
Protein, ur: NEGATIVE mg/dL
Specific Gravity, Urine: 1.02 (ref 1.005–1.030)
pH: 5 (ref 5.0–8.0)

## 2022-11-13 LAB — SARS CORONAVIRUS 2 BY RT PCR: SARS Coronavirus 2 by RT PCR: NEGATIVE

## 2022-11-13 LAB — RAPID URINE DRUG SCREEN, HOSP PERFORMED
Amphetamines: NOT DETECTED
Barbiturates: NOT DETECTED
Benzodiazepines: NOT DETECTED
Cocaine: NOT DETECTED
Opiates: NOT DETECTED
Tetrahydrocannabinol: NOT DETECTED

## 2022-11-13 LAB — COMPREHENSIVE METABOLIC PANEL
ALT: 25 U/L (ref 0–44)
AST: 25 U/L (ref 15–41)
Albumin: 3.5 g/dL (ref 3.5–5.0)
Alkaline Phosphatase: 72 U/L (ref 38–126)
Anion gap: 17 — ABNORMAL HIGH (ref 5–15)
BUN: 12 mg/dL (ref 8–23)
CO2: 19 mmol/L — ABNORMAL LOW (ref 22–32)
Calcium: 8.9 mg/dL (ref 8.9–10.3)
Chloride: 102 mmol/L (ref 98–111)
Creatinine, Ser: 0.92 mg/dL (ref 0.61–1.24)
GFR, Estimated: >60 mL/min (ref >60)
Glucose, Bld: 120 mg/dL — ABNORMAL HIGH (ref 70–99)
Potassium: 3.8 mmol/L (ref 3.5–5.1)
Sodium: 138 mmol/L (ref 135–145)
Total Bilirubin: 0.5 mg/dL (ref 0.3–1.2)
Total Protein: 6.7 g/dL (ref 6.5–8.1)

## 2022-11-13 LAB — CBG MONITORING, ED: Glucose-Capillary: 120 mg/dL — ABNORMAL HIGH (ref 70–99)

## 2022-11-13 LAB — ETHANOL: Alcohol, Ethyl (B): <10 mg/dL (ref <10)

## 2022-11-13 LAB — MAGNESIUM: Magnesium: 2.4 mg/dL (ref 1.7–2.4)

## 2022-11-13 MED ORDER — ACETAMINOPHEN 325 MG PO TABS
650.0000 mg | ORAL_TABLET | Freq: Once | ORAL | Status: AC
  Administered 2022-11-13: 650 mg via ORAL
  Filled 2022-11-13: qty 2

## 2022-11-13 MED ORDER — LEVETIRACETAM 500 MG PO TABS
1000.0000 mg | ORAL_TABLET | Freq: Two times a day (BID) | ORAL | Status: DC
  Administered 2022-11-13: 1000 mg via ORAL
  Filled 2022-11-13: qty 2

## 2022-11-13 MED ORDER — LEVETIRACETAM IN NACL 1000 MG/100ML IV SOLN
1000.0000 mg | Freq: Once | INTRAVENOUS | Status: AC
  Administered 2022-11-13: 1000 mg via INTRAVENOUS
  Filled 2022-11-13: qty 100

## 2022-11-13 NOTE — ED Notes (Signed)
Beth wife updated as to patient's status.

## 2022-11-13 NOTE — ED Notes (Signed)
Patient changed and new linen placed on bed.

## 2022-11-13 NOTE — ED Notes (Signed)
[facility] CALLED; NO ETA GIVEN ?

## 2022-11-13 NOTE — Discharge Instructions (Signed)
Krue's workup was reassuring in the ED.  His CT scan of the head, blood tests and urine sample did not show signs of new injuries, infection, or emergencies.  Sosaia should continue his current medications and should receive his evening Keppra and Vimpat dosing tonight.  He was given 1000 mg IV keppra on his arrival in the ER this morning.  Please ensure that he does not miss any doses of these medications, as this can trigger a seizure.  Please have him follow up with [facility].

## 2022-11-13 NOTE — ED Provider Notes (Signed)
[place] [facility]ERGENCY DEPARTMENT AT [facility] Provider Note   CSN: [id number] Arrival date & time: 11/13/22  9562     History  Chief Complaint  Patient presents with   Seizures    Alexander Henderson is a 62 y.o. male with history of traumatic brain injury and chronic seizures, on Keppra 1000 mg twice daily and Vimpat 200 mg twice daily, presenting from [facility] with concern for breakthrough seizure today.  Patient had a witnessed 1 minute generalized tonic-clonic seizure with some confusion afterwards.  He was transiently hypoxic and started on supplemental oxygen by EMS.  Patient appears to have cognitive difficulties at baseline, does not have a recollection of the events over the past several hours or days.  He says he feels "lousy".  But he denies headache specifically to me.  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  He was admitted to the hospital most recently in September 2022 with a breakthrough seizure at the time.  Also has a history of ulcerative colitis in remission.  His wife later present at bedside reports the patient last had a breakthrough seizure about 2 years ago when he came to the hospital.  She says are quite rare.  She feels that the seizures tend to happen when the patient is not given her misses a dose of one of his seizure medications, as he is quite regular with his Keppra and his Vimpat.  She feels the patient is at baseline.  HPI     Home Medications Prior to Admission medications   Medication Sig Start Date End Date Taking? Authorizing Provider  acetaminophen (TYLENOL) 500 MG tablet Take 500 mg by mouth every 8 (eight) hours as needed for mild pain or fever.     [provider]  bisacodyl (DULCOLAX) 10 MG suppository Place 10 mg rectally See admin instructions. Give 1 suppository (10 mg totally) rectally 1 dose in 24 hours as needed if not relief by  Milk of Magnesium    [provider]  calcium-vitamin D (OSCAL WITH D) 500-200 MG-UNIT tablet Take 1 tablet by mouth 2 (two) times daily.    [provider]  lacosamide (VIMPAT) 200 MG TABS tablet Take 1 tablet (200 mg total) by mouth 2 (two) times daily. 06/09/16   Arby Barrette, MD  levETIRAcetam (KEPPRA) 1000 MG tablet Take 2 tablets (2,000 mg total) by mouth 2 (two) times daily. Administer as a whole tablet. Do NOT crush, break or chew. 10/10/20 11/09/20  Princess Bruins, DO  Magnesium Hydroxide (MILK OF MAGNESIA PO) Take 30 mLs by mouth See admin instructions. Give 30 ml Milk of Magnesium by mouth 1 time in 24 hours as needed of no bowel movement in 3 days    [provider]  melatonin 3 MG TABS tablet Take 3 mg by mouth at bedtime.    [provider]  mesalamine (LIALDA) 1.2 g EC tablet Take 2.4 g by mouth daily with breakfast.     [provider]  nystatin (MYCOSTATIN/NYSTOP) powder Apply 1 application topically 3 (three) times daily as needed (skin folds/rash). To groin    [provider]  sennosides-docusate sodium (SENOKOT-S) 8.6-50 MG tablet Take 2 tablets by mouth at bedtime as needed for constipation.    [provider]  sertraline (ZOLOFT) 100 MG tablet Take 100 mg by mouth daily.     [provider]  Sodium Phosphates (RA SALINE ENEMA RE)  Place 1 each rectally See admin instructions. Give 1 dose rectally disposable Saline Enema in 24 hours as needed if not relief by Bisacodyl suppository    [provider]  zinc oxide (BALMEX) 11.3 % CREA cream Apply 1 application topically See admin instructions. Apply into the bottom and perineal area 6 times a days as needed for redness    [provider]      Allergies    Fluconazole    Review of Systems   Review of Systems  Physical Exam Updated Vital Signs BP (!) 130/93   Pulse 73   Temp 98.1 F (36.7 C) (Oral)   Resp 13   SpO2 97%  Physical  Exam  ED Results / Procedures / Treatments   Labs (all labs ordered are listed, but only abnormal results are displayed) Labs Reviewed  COMPREHENSIVE METABOLIC PANEL - Abnormal; Notable for the following components:      Result Value   CO2 19 (*)    Glucose, Bld 120 (*)    Anion gap 17 (*)    All other components within normal limits  URINALYSIS, ROUTINE W REFLEX MICROSCOPIC - Abnormal; Notable for the following components:   Ketones, ur 5 (*)    All other components within normal limits  CBG MONITORING, ED - Abnormal; Notable for the following components:   Glucose-Capillary 120 (*)    All other components within normal limits  SARS CORONAVIRUS 2 BY RT PCR  CBC WITH DIFFERENTIAL/PLATELET  MAGNESIUM  ETHANOL  RAPID URINE DRUG SCREEN, HOSP PERFORMED  LEVETIRACETAM LEVEL    EKG EKG Interpretation Date/Time:  Saturday November 13 2022 09:55:26 EDT Ventricular Rate:  95 PR Interval:  150 QRS Duration:  87 QT Interval:  346 QTC Calculation: 435 R Axis:   18  Text Interpretation: Sinus rhythm Confirmed by Alvester Chou [id number]) on 11/13/2022 9:58:14 AM  Radiology CT Head Wo Contrast  Result Date: 11/13/2022 CLINICAL DATA:  62 year old male with history of altered mental status. EXAM: CT HEAD WITHOUT CONTRAST TECHNIQUE: Contiguous axial images were obtained from the base of the skull through the vertex without intravenous contrast. RADIATION DOSE REDUCTION: This exam was performed according to the departmental dose-optimization program which includes automated exposure control, adjustment of the mA and/or kV according to patient size and/or use of iterative reconstruction technique. COMPARISON:  Head CT 04/25/2021. FINDINGS: Brain: Mild cerebral atrophy. Patchy and confluent areas of decreased attenuation are noted throughout the deep and periventricular white matter of the cerebral hemispheres bilaterally, compatible with chronic microvascular ischemic disease. No evidence of  acute infarction, hemorrhage, hydrocephalus, extra-axial collection or mass lesion/mass effect. Vascular: No hyperdense vessel or unexpected calcification. Skull: Normal. Negative for fracture or focal lesion. Sinuses/Orbits: No acute finding. Other: None. IMPRESSION: 1. No acute intracranial abnormalities. 2. Mild cerebral atrophy with chronic microvascular ischemic changes in the cerebral white matter, as above. Electronically Signed   By: Trudie Reed M.D.   On: 11/13/2022 11:34    Procedures Procedures    Medications Ordered in ED Medications  levETIRAcetam (KEPPRA) IVPB 1000 mg/100 mL premix (0 mg Intravenous Stopped 11/13/22 1036)    ED Course/ Medical Decision Making/ A&P Clinical Course as of 11/13/22 1423  Sat Nov 13, 2022  1341 Cath for UA performed, wife at bedside updated [MT]    Clinical Course User Index [MT] Kamani Magnussen, Kermit Balo, MD  Medical Decision Making Amount and/or Complexity of Data Reviewed Labs: ordered. Radiology: ordered.  Risk Prescription drug management.   This patient presents to the ED with concern for witnessed breakthrough seizure. This involves an extensive number of treatment options, and is a complaint that carries with it a high risk of complications and morbidity.    Co-morbidities that complicate the patient evaluation: History of traumatic brain injury and prior seizures at high risk of breakthrough seizure  Additional history obtained from the patient's wife at bedside as well as EMS  External records from outside source obtained and reviewed including neurology evaluation, patient is on Keppra and Vimpat  I ordered and personally interpreted labs.  The pertinent results include: No emergent findings  I ordered imaging studies including CT of the head I independently visualized and interpreted imaging which showed stable, no emergent findings I agree with the radiologist interpretation  The patient  was maintained on a cardiac monitor.  I personally viewed and interpreted the cardiac monitored which showed an underlying rhythm of: Sinus rhythm  Patient is EKG per interpretation is no acute ischemic findings  I ordered medication including IV Keppra per home regimen  I have reviewed the patients home medicines and have made adjustments as needed   After the interventions noted above, I reevaluated the patient and found that they have: stayed the same -patient is at baseline mental status according to his wife  Dispostion:  After consideration of the diagnostic results and the patients response to treatment, I feel that the patent would benefit from discharge back to care facility with outpatient follow-up.  I did speak to the neurologist Dr Wilford Corner regarding medication adjustments.  Given that the patient has not had a breakthrough seizure nearly 2 years, this is an isolated incident, there is no immediate indication to increase antiepileptic dosage.  If the patient continues to have increased seizures, regimen could be increased 1000 mg twice daily to 1250 mg twice daily, keeping Vimpat on his current dose of 200 mg twice daily.  The patient and his wife are both comfortable this plan and maintaining him on the current regimen.  His wife prefers that medication change to be performed by his PCP, or [facility].         Final Clinical Impression(s) / ED Diagnoses Final diagnoses:  Seizure [facility])    Rx / DC Orders ED Discharge Orders     None         Terald Sleeper, MD 11/13/22 1423

## 2022-11-13 NOTE — ED Notes (Signed)
Pt leaving ED in stable condition at this time with [facility] transport back to [place].

## 2022-11-13 NOTE — ED Notes (Signed)
Family at bedside. 

## 2022-11-15 LAB — LEVETIRACETAM LEVEL: Levetiracetam Lvl: 6 ug/mL — ABNORMAL LOW (ref 10.0–40.0)

## 2024-01-11 IMAGING — CT CT HEAD W/O CM
3 of 4 series · 15 of 47 positions shown, 18 images · non-contrast
Comparison: 10/09/2020

CLINICAL DATA: Head trauma, moderate-severe



[Series 4: head 2.0 h70h · axial · 0.44mm/px · z∈[-145,-13]mm · 9 of 84 slices shown, 12 images]
[im 9/84  brain]
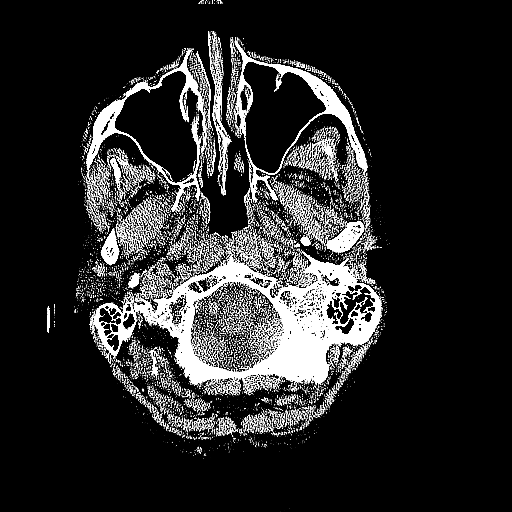
[im 9/84  bone]
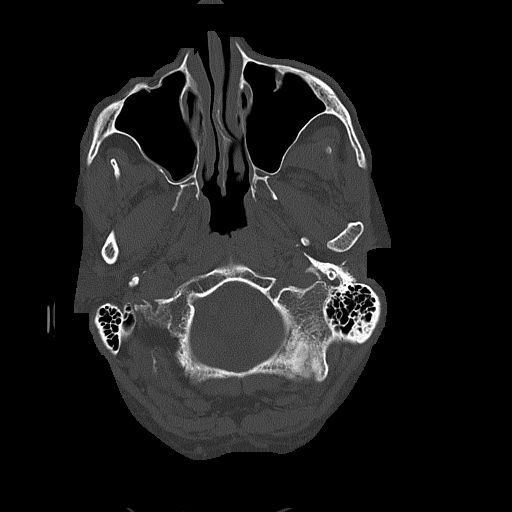
[im 17/84  brain]
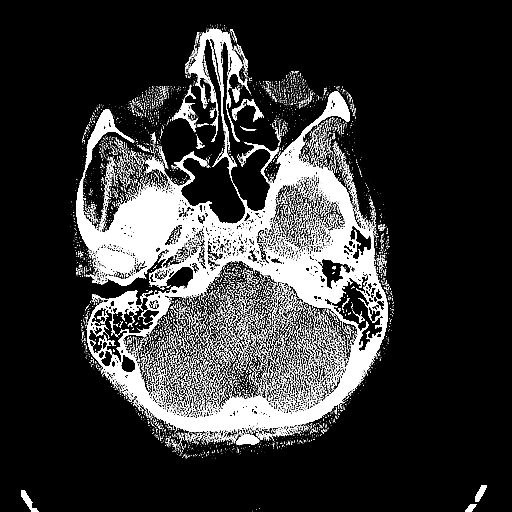
[im 25/84  brain]
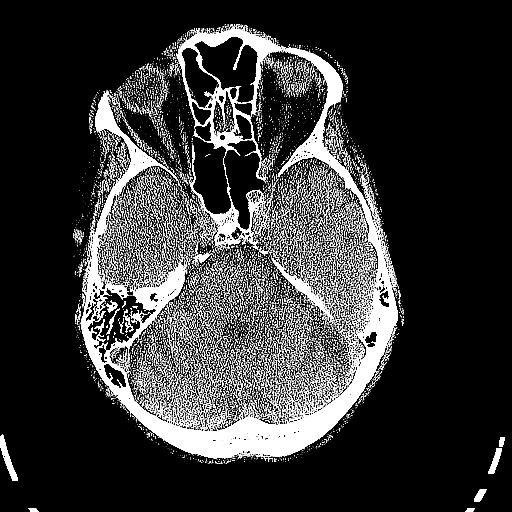
[im 34/84  brain]
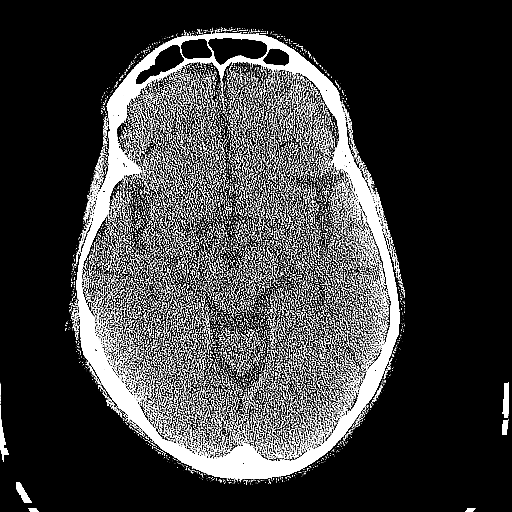
[im 42/84  brain]
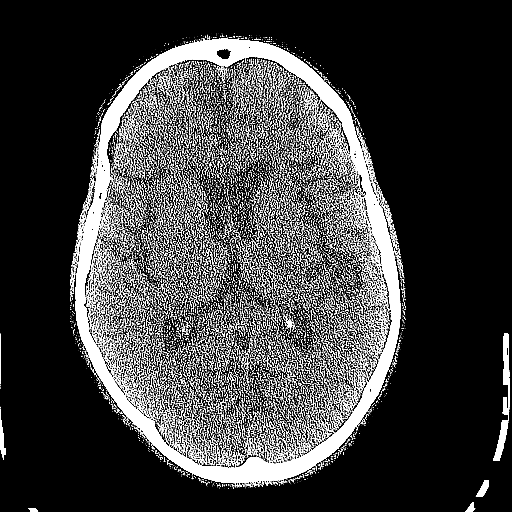
[im 42/84  bone]
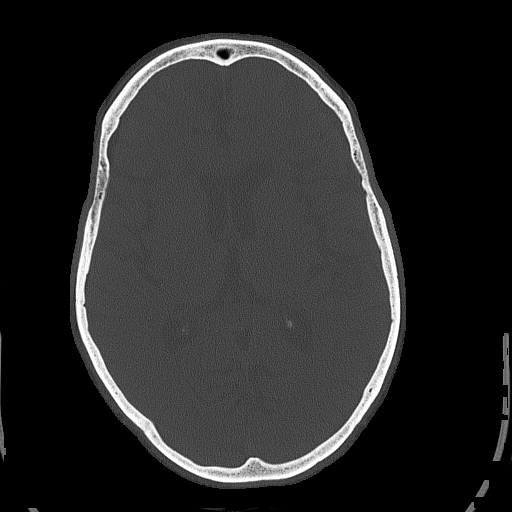
[im 50/84  brain]
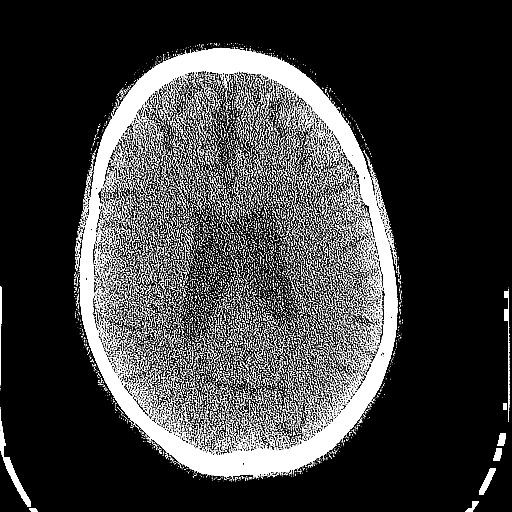
[im 59/84  brain]
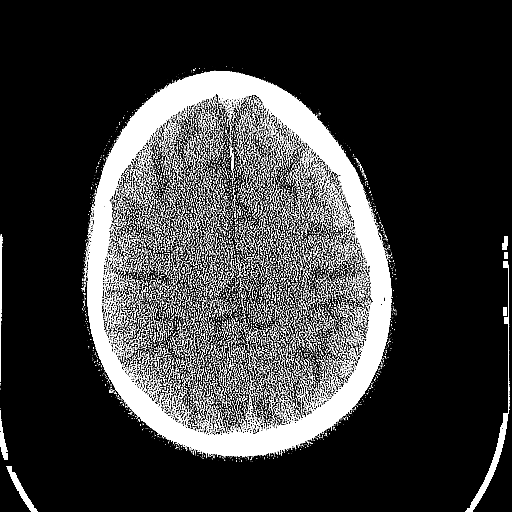
[im 67/84  brain]
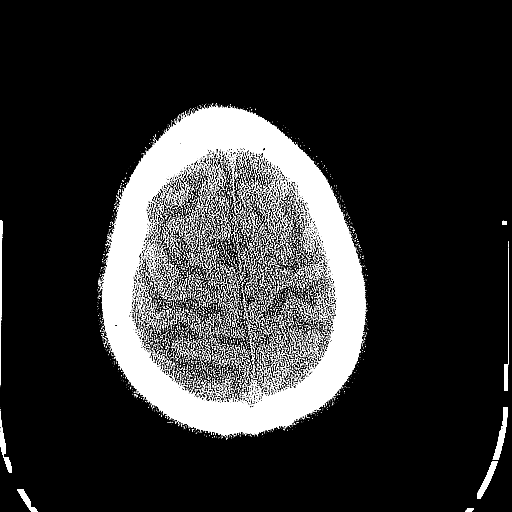
[im 75/84  brain]
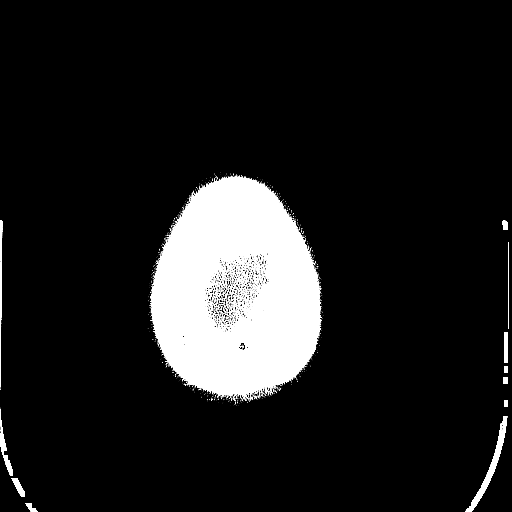
[im 75/84  bone]
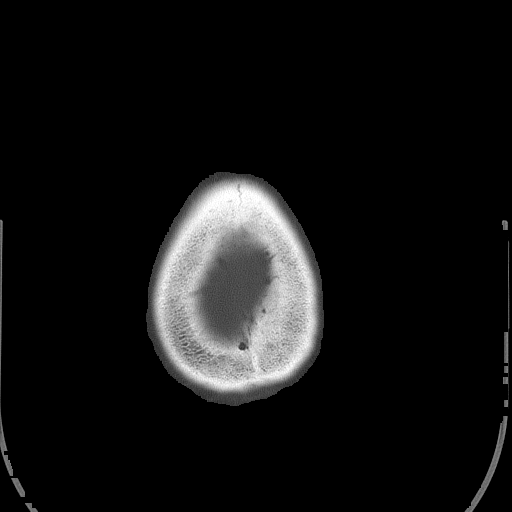

[Series 5: head 3.0 mpr cor · coronal · 0.33mm/px · 3 of 76 slices shown]
[im 26/76  brain]
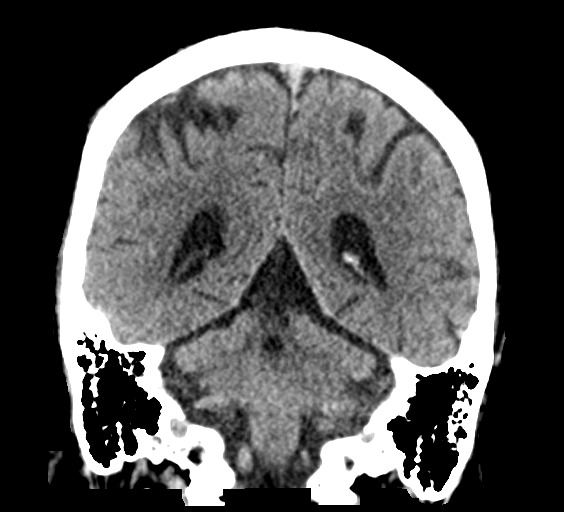
[im 34/76  brain]
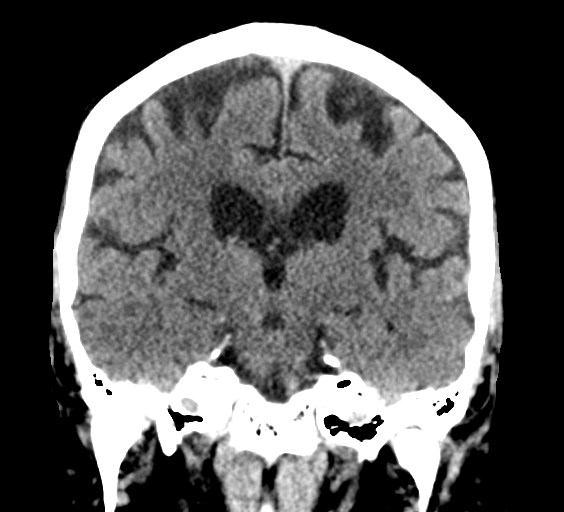
[im 42/76  brain]
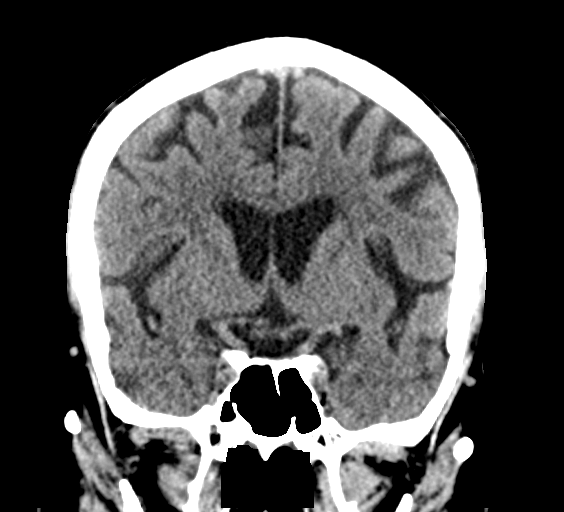

[Series 6: head 3.0 mpr sag · sagittal · 0.33mm/px · 3 of 62 slices shown]
[im 21/62  brain]
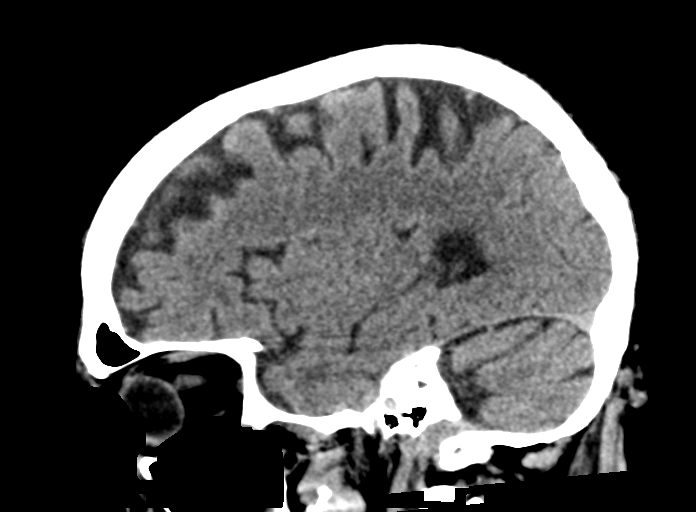
[im 31/62  brain]
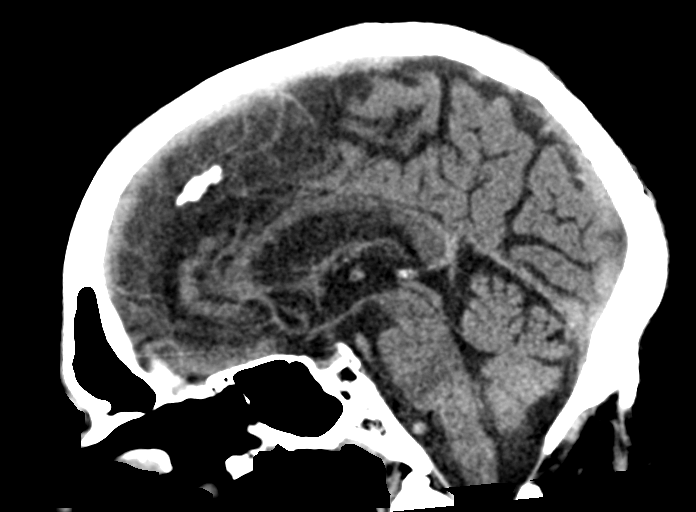
[im 41/62  brain]
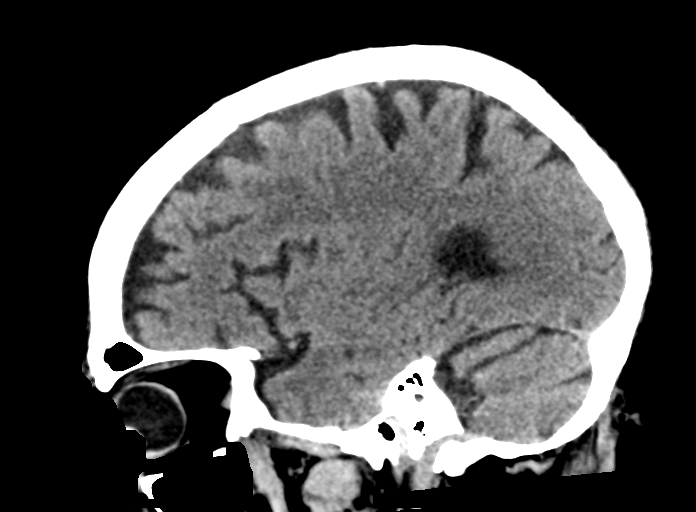

[15 of 47 positions shown; findings below may reference images not displayed]

FINDINGS: Brain: No acute intracranial abnormality. Specifically, no
hemorrhage, hydrocephalus, mass lesion, acute infarction, or
significant intracranial injury.

Vascular: No hyperdense vessel or unexpected calcification.

Skull: No acute calvarial abnormality.

Sinuses/Orbits: No acute findings

Other: None
IMPRESSION: No acute intracranial abnormality.

## 2024-01-11 IMAGING — CT CT MAXILLOFACIAL W/O CM
4 series · 16 of 47 positions shown, 18 images · non-contrast
Comparison: None.

CLINICAL DATA: Facial trauma, blunt



[Series 3: facial/ orbits 2.0 h30s · axial · 0.36mm/px · z∈[-222,-92]mm · 8 of 85 slices shown, 10 images]
[im 10/85  brain]
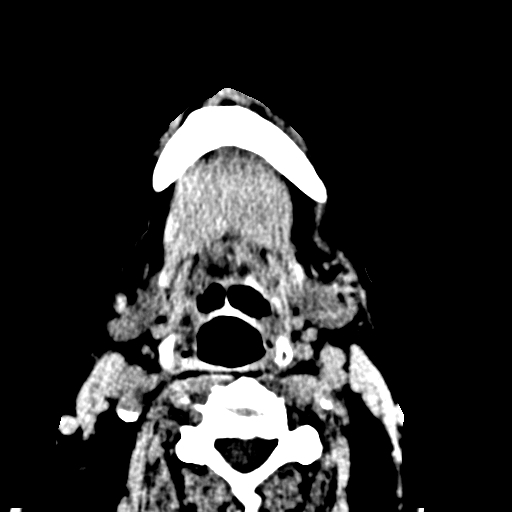
[im 10/85  bone]
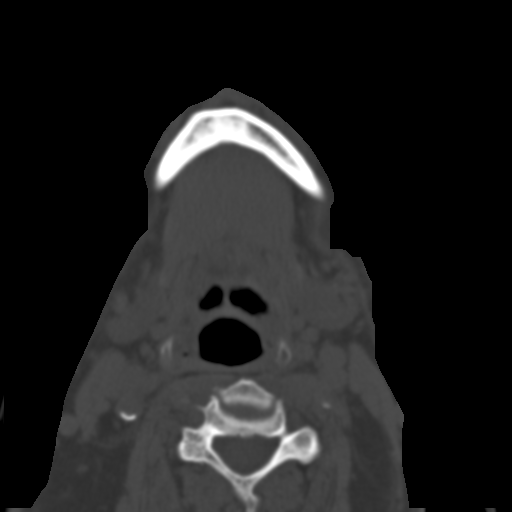
[im 19/85  bone]
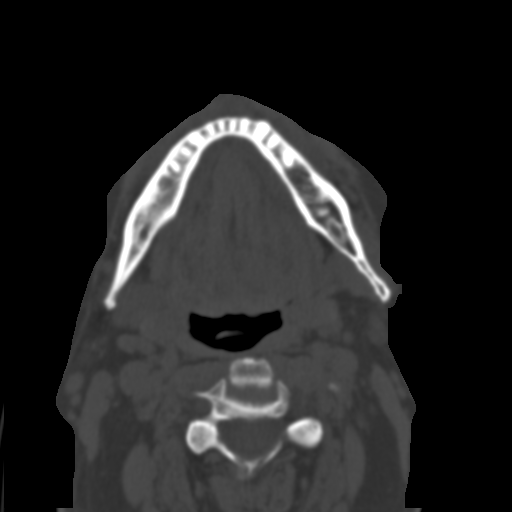
[im 29/85  bone]
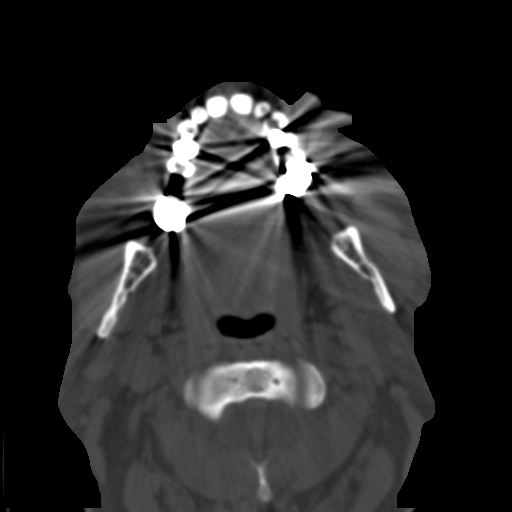
[im 38/85  bone]
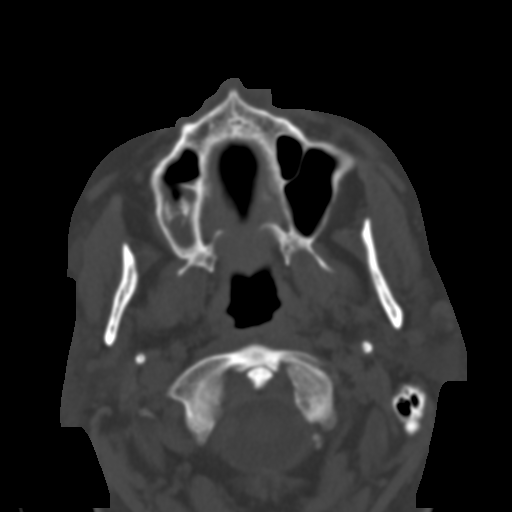
[im 47/85  brain]
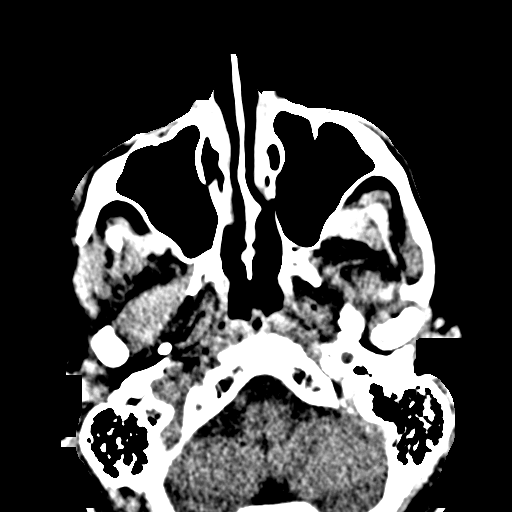
[im 47/85  bone]
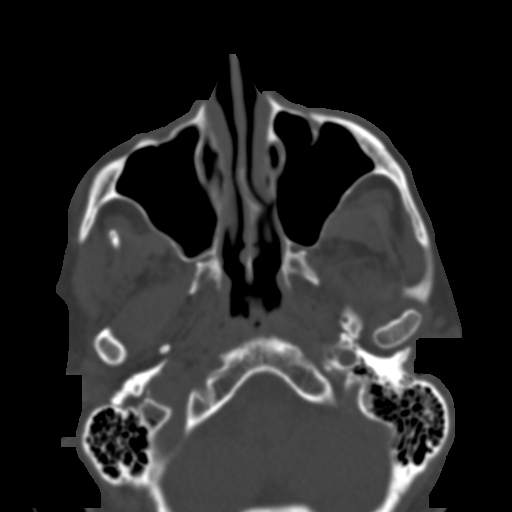
[im 57/85  bone]
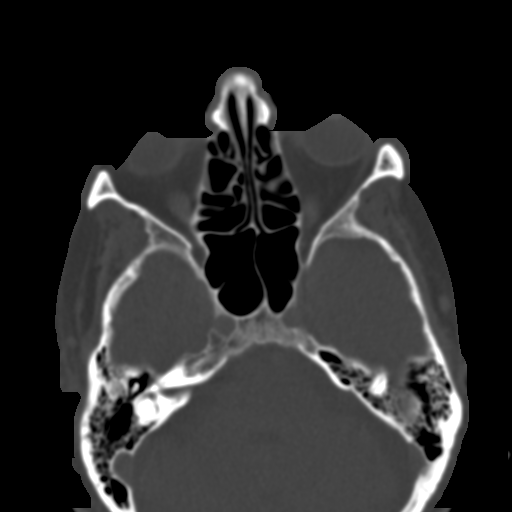
[im 66/85  bone]
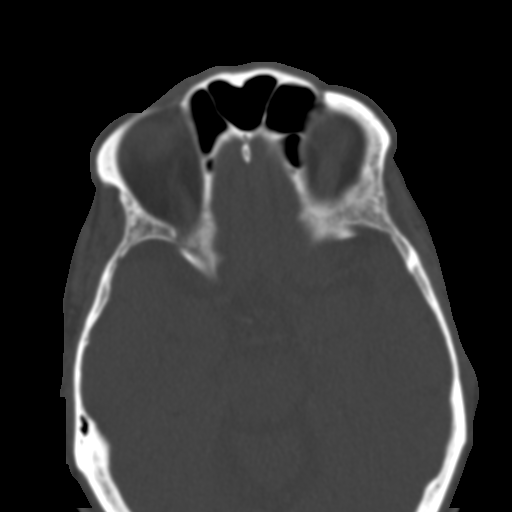
[im 75/85  bone]
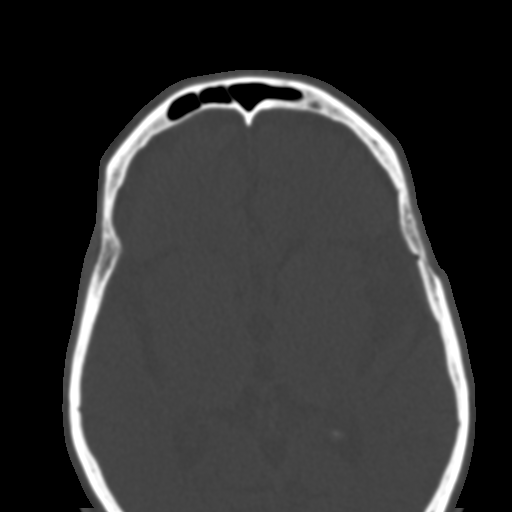

[Series 5: 1.0 thin soft tissue · axial · 0.36mm/px · z∈[-223,-205]mm · 2 of 169 slices shown]
[im 18/169  brain]
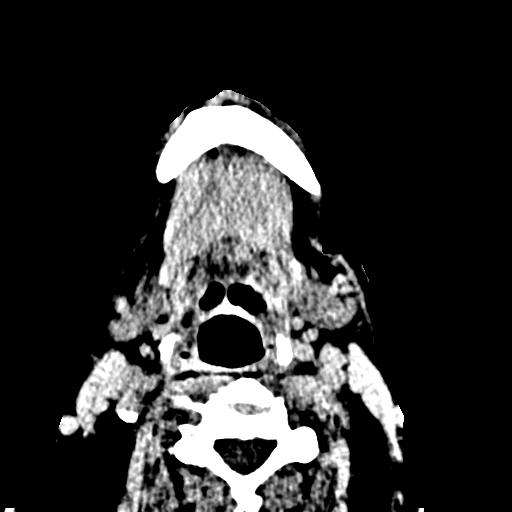
[im 36/169  brain]
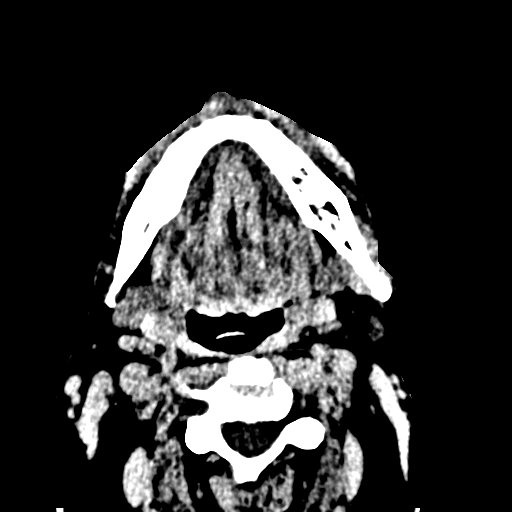

[Series 7: coronal soft tissue · coronal · 0.33mm/px · 3 of 97 slices shown]
[im 33/97  bone]
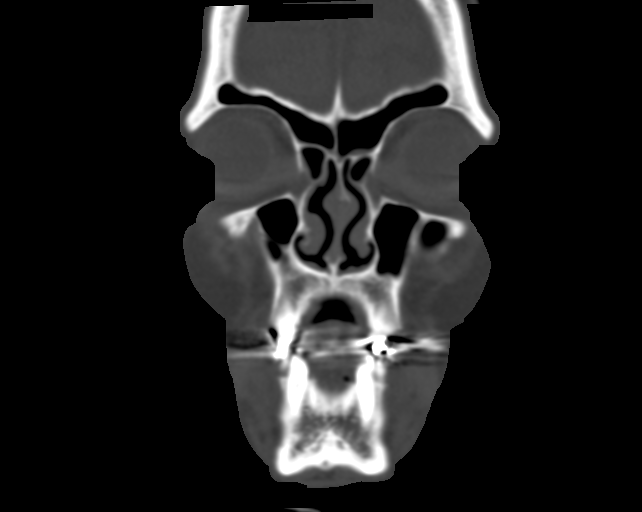
[im 43/97  bone]
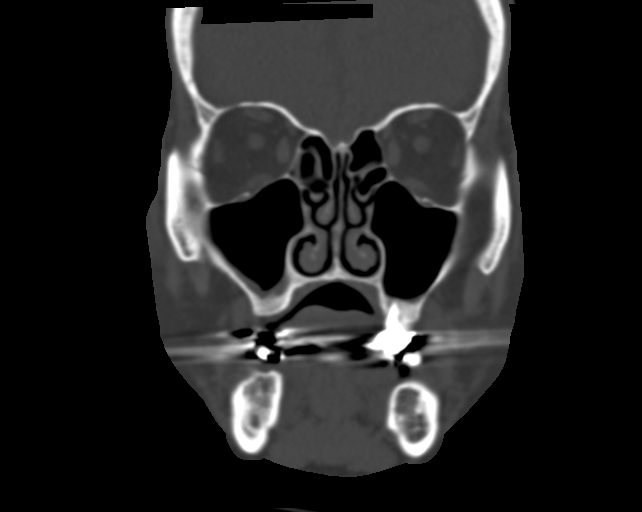
[im 54/97  bone]
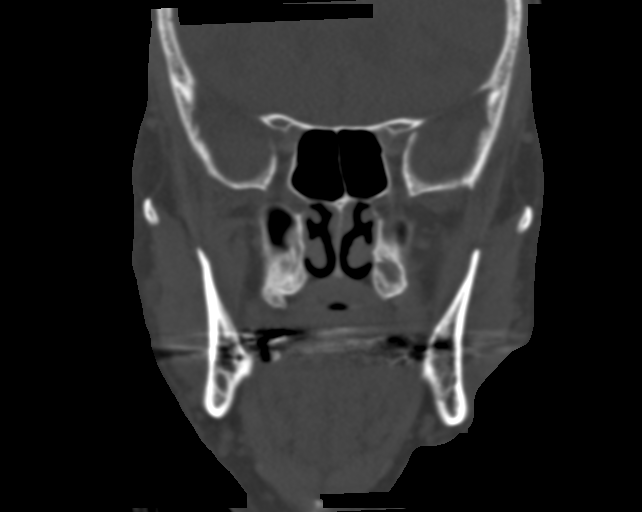

[Series 8: sagittal soft tissue · sagittal · 0.33mm/px · 3 of 105 slices shown]
[im 35/105  bone]
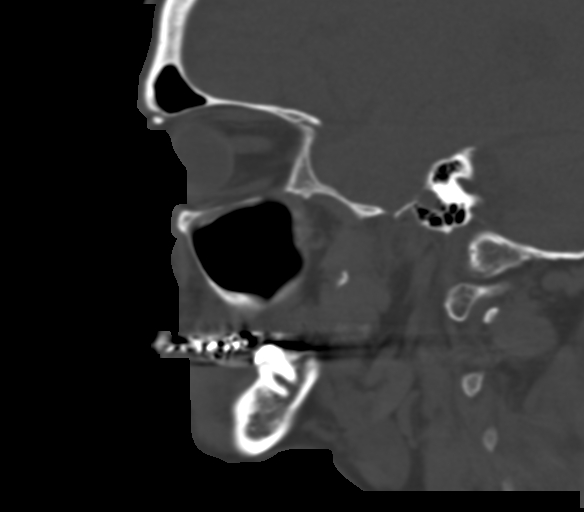
[im 53/105  bone]
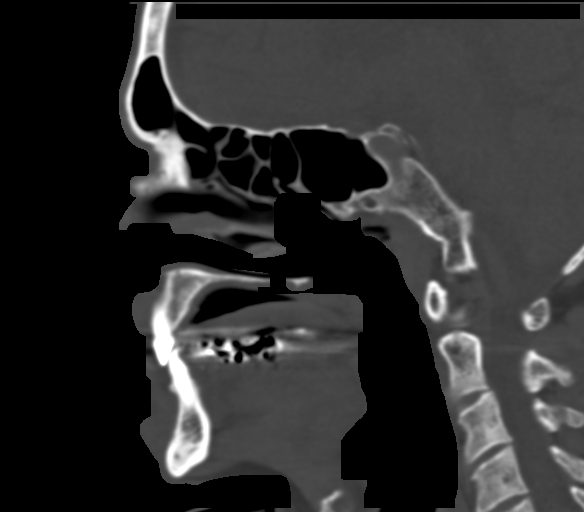
[im 70/105  bone]
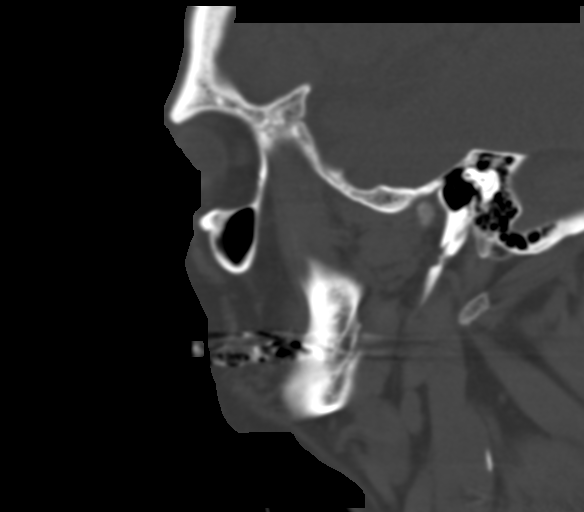

[16 of 47 positions shown; findings below may reference images not displayed]

FINDINGS: Osseous: No fracture or mandibular dislocation. No destructive
process.

Orbits: Negative. No traumatic or inflammatory finding.

Sinuses: No air-fluid levels. Minimal mucosal thickening in the
right paranasal sinuses.

Soft tissues: Mild soft tissue swelling over the right face.

Limited intracranial: See head CT report
IMPRESSION: No facial or orbital fracture.
